# Patient Record
Sex: Male | Born: 1935 | Race: Black or African American | Hispanic: No | Smoking: Current every day smoker
Health system: Southern US, Community
[De-identification: ages and names within clinical notes are randomized; demographics above are authoritative.]

## PROBLEM LIST (undated history)

## (undated) DIAGNOSIS — R05 Cough: Secondary | ICD-10-CM

## (undated) DIAGNOSIS — R42 Dizziness and giddiness: Secondary | ICD-10-CM

## (undated) DIAGNOSIS — R059 Cough, unspecified: Secondary | ICD-10-CM

## (undated) DIAGNOSIS — E119 Type 2 diabetes mellitus without complications: Secondary | ICD-10-CM

## (undated) DIAGNOSIS — H409 Unspecified glaucoma: Secondary | ICD-10-CM

## (undated) DIAGNOSIS — M1990 Unspecified osteoarthritis, unspecified site: Secondary | ICD-10-CM

## (undated) DIAGNOSIS — C259 Malignant neoplasm of pancreas, unspecified: Secondary | ICD-10-CM

## (undated) DIAGNOSIS — K219 Gastro-esophageal reflux disease without esophagitis: Secondary | ICD-10-CM

## (undated) DIAGNOSIS — K259 Gastric ulcer, unspecified as acute or chronic, without hemorrhage or perforation: Secondary | ICD-10-CM

## (undated) DIAGNOSIS — I10 Essential (primary) hypertension: Secondary | ICD-10-CM

## (undated) DIAGNOSIS — N390 Urinary tract infection, site not specified: Secondary | ICD-10-CM

## (undated) DIAGNOSIS — S31119A Laceration without foreign body of abdominal wall, unspecified quadrant without penetration into peritoneal cavity, initial encounter: Secondary | ICD-10-CM

## (undated) DIAGNOSIS — R531 Weakness: Secondary | ICD-10-CM

## (undated) DIAGNOSIS — B1920 Unspecified viral hepatitis C without hepatic coma: Secondary | ICD-10-CM

## (undated) HISTORY — DX: Weakness: R53.1

## (undated) HISTORY — DX: Cough, unspecified: R05.9

## (undated) HISTORY — PX: OTHER SURGICAL HISTORY: SHX169

## (undated) HISTORY — DX: Urinary tract infection, site not specified: N39.0

## (undated) HISTORY — DX: Malignant neoplasm of pancreas, unspecified: C25.9

## (undated) HISTORY — DX: Laceration without foreign body of abdominal wall, unspecified quadrant without penetration into peritoneal cavity, initial encounter: S31.119A

## (undated) HISTORY — DX: Cough: R05

## (undated) HISTORY — DX: Unspecified glaucoma: H40.9

## (undated) HISTORY — PX: LAPAROSCOPIC GASTROTOMY W/ REPAIR OF ULCER: SUR772

## (undated) HISTORY — DX: Dizziness and giddiness: R42

---

## 2005-09-18 ENCOUNTER — Emergency Department: Payer: Self-pay | Admitting: Emergency Medicine

## 2006-05-30 ENCOUNTER — Other Ambulatory Visit: Payer: Self-pay

## 2006-05-31 ENCOUNTER — Inpatient Hospital Stay: Payer: Self-pay | Admitting: Internal Medicine

## 2008-03-28 ENCOUNTER — Inpatient Hospital Stay: Payer: Self-pay | Admitting: Internal Medicine

## 2008-03-28 ENCOUNTER — Other Ambulatory Visit: Payer: Self-pay

## 2008-03-29 ENCOUNTER — Other Ambulatory Visit: Payer: Self-pay

## 2009-07-02 ENCOUNTER — Emergency Department: Payer: Self-pay | Admitting: Unknown Physician Specialty

## 2013-02-25 LAB — COMPREHENSIVE METABOLIC PANEL
Albumin: 2.7 g/dL — ABNORMAL LOW (ref 3.4–5.0)
Alkaline Phosphatase: 85 U/L (ref 50–136)
BUN: 34 mg/dL — ABNORMAL HIGH (ref 7–18)
Co2: 23 mmol/L (ref 21–32)
Creatinine: 1.65 mg/dL — ABNORMAL HIGH (ref 0.60–1.30)
Glucose: 140 mg/dL — ABNORMAL HIGH (ref 65–99)
Osmolality: 291 (ref 275–301)
Potassium: 3.9 mmol/L (ref 3.5–5.1)
SGOT(AST): 25 U/L (ref 15–37)
Total Protein: 6.4 g/dL (ref 6.4–8.2)

## 2013-02-25 LAB — CBC
HCT: 26.4 % — ABNORMAL LOW (ref 40.0–52.0)
HGB: 8.8 g/dL — ABNORMAL LOW (ref 13.0–18.0)
MCH: 29.8 pg (ref 26.0–34.0)
MCHC: 33.3 g/dL (ref 32.0–36.0)

## 2013-02-25 LAB — URINALYSIS, COMPLETE
Bacteria: NONE SEEN
Bilirubin,UR: NEGATIVE
Blood: NEGATIVE
Glucose,UR: NEGATIVE mg/dL (ref 0–75)
Granular Cast: 41
Ketone: NEGATIVE
Nitrite: NEGATIVE
Protein: NEGATIVE
Squamous Epithelial: 5

## 2013-02-25 LAB — PHOSPHORUS: Phosphorus: 3.7 mg/dL (ref 2.5–4.9)

## 2013-02-25 LAB — CK TOTAL AND CKMB (NOT AT ARMC): CK, Total: 59 U/L (ref 35–232)

## 2013-02-26 ENCOUNTER — Inpatient Hospital Stay: Payer: Self-pay | Admitting: Internal Medicine

## 2013-02-26 LAB — CK TOTAL AND CKMB (NOT AT ARMC)
CK, Total: 46 U/L (ref 35–232)
CK, Total: 42 U/L (ref 35–232)
CK-MB: 1.2 ng/mL (ref 0.5–3.6)

## 2013-02-27 LAB — COMPREHENSIVE METABOLIC PANEL
Alkaline Phosphatase: 83 U/L (ref 50–136)
BUN: 18 mg/dL (ref 7–18)
Bilirubin,Total: 0.3 mg/dL (ref 0.2–1.0)
Calcium, Total: 8.4 mg/dL — ABNORMAL LOW (ref 8.5–10.1)
Chloride: 113 mmol/L — ABNORMAL HIGH (ref 98–107)
Co2: 26 mmol/L (ref 21–32)
Creatinine: 0.96 mg/dL (ref 0.60–1.30)
EGFR (African American): >60
EGFR (Non-African Amer.): >60
Glucose: 83 mg/dL (ref 65–99)
Osmolality: 290 (ref 275–301)
Potassium: 4.4 mmol/L (ref 3.5–5.1)
SGPT (ALT): 21 U/L (ref 12–78)
Sodium: 145 mmol/L (ref 136–145)

## 2013-02-27 LAB — DRUG SCREEN, URINE
Benzodiazepine, Ur Scrn: NEGATIVE (ref ?–200)
Cannabinoid 50 Ng, Ur ~~LOC~~: POSITIVE (ref ?–50)
Cocaine Metabolite,Ur ~~LOC~~: NEGATIVE (ref ?–300)
Methadone, Ur Screen: NEGATIVE (ref ?–300)
Opiate, Ur Screen: NEGATIVE (ref ?–300)
Tricyclic, Ur Screen: NEGATIVE (ref ?–1000)

## 2013-02-27 LAB — CBC WITH DIFFERENTIAL/PLATELET
Basophil #: 0 10*3/uL (ref 0.0–0.1)
Basophil %: 0.6 %
Eosinophil #: 0.1 10*3/uL (ref 0.0–0.7)
HCT: 27.7 % — ABNORMAL LOW (ref 40.0–52.0)
HGB: 9.3 g/dL — ABNORMAL LOW (ref 13.0–18.0)
Lymphocyte %: 34.5 %
MCH: 29.8 pg (ref 26.0–34.0)
MCHC: 33.5 g/dL (ref 32.0–36.0)
Monocyte %: 9.8 %
Neutrophil %: 52 %
Platelet: 119 10*3/uL — ABNORMAL LOW (ref 150–440)
RDW: 14.5 % (ref 11.5–14.5)
WBC: 4.8 10*3/uL (ref 3.8–10.6)

## 2013-02-27 LAB — PROTIME-INR
INR: 1
Prothrombin Time: 13.5 secs (ref 11.5–14.7)

## 2015-03-19 NOTE — Consult Note (Signed)
Brief Consult Note: Diagnosis: Anemia with heme positive stools.   Patient was seen by consultant.   Comments: Patient with multiple issues including dropping hemoglobin and heme positive stools. No signs of active GI bleed. Patient with h/o cirrhosis and does not remember having an EGD. Continue Protonix. Follow H and H. EGD in am. PT/INR in am. Will follow.  Electronic Signatures: Jill Side (MD)  (Signed 02-Apr-14 18:58)  Authored: Brief Consult Note   Last Updated: 02-Apr-14 18:58 by Jill Side (MD)

## 2015-03-19 NOTE — Consult Note (Signed)
PATIENT NAME:  Gerald Molina, Gerald Molina MR#:  [id number] DATE OF BIRTH:  07-06-1935  DATE OF CONSULTATION:  02/27/2013  REFERRING PHYSICIAN:   CONSULTING PHYSICIAN:  Jill Side, MD  REASON FOR CONSULTATION: Heme-positive stool and anemia.   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 He was admitted the day before yesterday with syncope. The patient was also reporting nausea, vomiting, and diarrhea for about 3 days after which he started to feel weak and apparently passed out. No urinary or stool incontinence was reported. No seizure-like activity either. CT scan of the head was unremarkable in the Emergency Room. The patient's blood pressure was somewhat low in the 90s and his creatinine was somewhat high consistent with volume depletion and dehydration. The patient's hemoglobin was noted to be low at 8.8 with a baseline of about 11. His stools were heme-positive. The patient is on iron and therefore color of the stool was not of any great help. He denies any nausea, vomiting, bright red blood per rectum or hematemesis. Apparently, the patient has had a colonoscopy at the [place], but has not had an EGD according to him.   PAST MEDICAL HISTORY: Diabetes, hypertension, cirrhosis of the liver, anemia and history of glaucoma.   PAST SURGICAL HISTORY: None.   HOME MEDICATIONS: Aspirin 325 mg a day, glyburide, metformin, simvastatin, hydroxyzine, Norvasc and ferrous sulfate.   SOCIAL HISTORY: Smokes about a pack a day. Quit his drinking about 20 years ago.   REVIEW OF SYSTEMS: Grossly negative now except for what is mentioned in the history of present illness.   PHYSICAL EXAMINATION:  GENERAL: Chronically ill-appearing male, does not appear to  be in any acute distress, quite awake and alert.  VITAL SIGNS: Temperature 98.4, heart rate is in 50s, respirations 18 to 20, blood pressure 146/83. Clinically he is not jaundiced.  NECK: Neck veins are flat.  LUNGS: Grossly clear to auscultation.  CARDIOVASCULAR: Regular rate and rhythm. No gallops or murmurs.  ABDOMEN: Did not reveal any hepatosplenomegaly. No ascites was noted. Bowel sounds are positive. Nontender.  NEUROLOGIC: Appears to be fairly nonfocal.   LABORATORIES: Hemoglobin on admission was 8.8, platelet count is slightly reduced at 139, MCV is normal at 90 and white cell count is normal at 7.1. Hemoglobin today is 9.3, white cell count is 4.8, platelet count 119. As mentioned above, his stools were occult blood positive. INR is 1. Liver enzymes are normal, although albumin is low at 2.5. CT of the head unremarkable.   ASSESSMENT AND PLAN: A patient with history of cirrhosis of the liver secondary to hepatitis C and history of ethanol abuse in the past. The patient came in with nausea, vomiting and diarrhea. He was dehydrated with high creatinine and he passed out. The patient also is  anemic and his hemoglobin is much lower than his baseline raising some concerns about possible recent GI bleed. Due to his history of underlying cirrhosis, upper GI bleed from lesion such as portal hypertensive gastropathy is most likely. Apparently, the patient has not had an EGD or at least not recently and therefore it was recommended that he undergo an EGD for further evaluation and to rule out esophageal varices, although this does not appear to be case of variceal  bleeding. The patient is in full agreement. Will proceed with an upper GI endoscopy today. Further recommendations depending on the results of the EGD. His hemoglobin and hematocrit now seem to be stable. Will follow.  ____________________________ Jill Side, MD si:aw D: 02/27/2013 11:39:59 ET T: 02/27/2013 11:49:20  ET JOB#: [id number]  cc: Jill Side, MD, <Dictator> Jill Side MD ELECTRONICALLY SIGNED 03/01/2013 16:05

## 2015-03-19 NOTE — Discharge Summary (Signed)
PATIENT NAME:  Gerald Molina, Gerald Molina MR#:  [id number] DATE OF BIRTH:  [birth year]  DATE OF ADMISSION:  02/26/2013 DATE OF DISCHARGE:  02/27/2013  ADMITTING PHYSICIAN:  Phillips Climes, MD  DISCHARGING PHYSICIAN:  Gladstone Lighter, MD  PRIMARY CARE PHYSICIAN: [facility]:  GI consultation by Dr. Dionne Milo.   DISCHARGE DIAGNOSES:   1.  Near syncope likely vasovagal.  2.  Acute on chronic anemia from chronic upper gastrointestinal bleed status post esophagogastroduodenoscopy showing a gastric ulcer. No transfusion requirements this hospitalization.  3.  Acute renal failure.  4.  Hepatitis C and liver cirrhosis.  5.  Tobacco use disorder.  6.  Degenerative arthritis.  7.  Urinary tract infection.  8.  Diabetes mellitus.   DISCHARGE HOME MEDICATIONS: 1.  Ferrous sulfate 325 mg p.o. b.i.d.  2.  Metformin 500 mg p.o. b.i.d.  3.  Glyburide 5 mg p.o. b.i.d.  4.  Hydroxyzine 10 mg 1 tablet once a day. 5.  Brimonidine ophthalmic solution 1 drop to each eye 3 times a day.  6.  Colace 100 mg p.o. daily.  7.  Dorzolamide/Timolol ophthalmic solution 1 drop to each eye twice a day. 8.  Latanoprost 0.005% ophthalmic solution 1 drop to each eye at bedtime.  9.  Simvastatin 5 mg at bedtime.  10.  Glucerna shakes between meals.  11.  Norvasc 5 mg p.o. daily.  12.  Levaquin 250 mg for 3 more days.  13.  Protonix 40 mg p.o. daily.   DISCHARGE DIET: Low-sodium diet.   DISCHARGE ACTIVITY: As tolerated.   FOLLOWUP INSTRUCTIONS: 1.  PCP follow up. 2.  [facility]   LABORATORY DATA AND IMAGING STUDIES:  Urine tox screen positive for marijuana. WBC 4.8, hemoglobin 9.3, hematocrit 27.7, platelet count 119.  Sodium 145, potassium 4.4, chloride 113, bicarb 26, BUN 18, creatinine 0.96, glucose 83 and calcium 8.4.   INR 1.9.   CT of the head without contrast showing no acute intracranial abnormalities. Urinalysis with 1+ leukocyte esterase, few WBCs and  also bacteria.  Chest x-ray showing no acute cardiopulmonary disease, coarse right lower lung markings, possibly fibrosis.     Upper GI endoscopy done by Dr. Dionne Milo on 02/27/2013 showing gastritis, hiatal hernia and duodenal ulcer with clean base.   BRIEF HOSPITAL COURSE:  The patient is a 80 year old African American male with past medical history significant for diabetes mellitus, arthritis, liver cirrhosis and also hepatitis C who is usually followed up at [place] and had a screening colonoscopy within the past year and he says no abnormalities were comes.  He comes to the hospital after he had a syncopal episode.   1.  Syncope, likely vasovagal.  The patient  was hypotensive, dehydrated and also was found to be in acute renal failure on admission. He has not had further syncopal episodes while in the hospital. Orthostatic blood pressures have improved after IV fluids were given to the patient.  He was monitored on the cardiac unit without any further syncope. He has also has not had any arrhythmias.   2.  Acute on chronic anemia. His hemoglobin from [place] records 3 weeks ago was about 11. Hemoglobin on admission was 8.8 and he was feeling dizzy and also lightheaded and extremely weak lately. He takes naproxen for his arthritis b.i.d. and also with his cirrhosis. He never had an upper GI endoscopy from what the patient was tell, so upper GI endoscopy was done and it  did not show any varices but had a clean duodenal ulcer, so it could probably be chronic GI bleed.  He was advised to take iron supplement as an outpatient and advised to start Protonix twice a day. His aspirin was being held at the time of discharge.   3.  Acute renal failure improved with IV hydration.  4.  Urinary tract infection. He was on Levaquin and he is being discharged on the same to continue for 23more days.  5.  Diabetes mellitus. Home medications were continued. His course has been otherwise uneventful in the hospital.    DISCHARGE CONDITION: Stable.   DISCHARGE DISPOSITION: To home.  Instructions were given to both the patient and daughter at the time of discharge.   Time spent on discharge: 45 minutes.     ____________________________ Gladstone Lighter, MD rk:ct D: 02/28/2013 14:22:47 ET T: 02/28/2013 14:35:52 ET JOB#: [id number]  cc: Gladstone Lighter, MD, <Dictator> Gladstone Lighter MD ELECTRONICALLY SIGNED 03/14/2013 15:44

## 2015-03-19 NOTE — H&P (Signed)
PATIENT NAME:  Gerald Molina, Gerald Molina MR#:  [id number] DATE OF BIRTH:  03-Jan-1935  DATE OF ADMISSION:  02/26/2013  REFERRING PHYSICIAN:  Dr. Tamela Oddi Braud.   PRIMARY CARE PHYSICIAN:  Following at [place]:  Syncope.   HISTORY OF PRESENT ILLNESS:  This is a pleasant 80 year old male with significant past medical history of liver cirrhosis secondary to hepatitis C and alcohol abuse, reports no alcohol abuse in the last 20 years, history of diabetes, hypertension, anemia, that presents with complaints of syncope, the patient reports he has been having nausea, vomiting and diarrhea for the last three days, reports has been feeling weak, but denies any fever or chills, reports he has been feeling dizzy over the last couple of days, mainly when he stands up, reports he went to open the door for his granddaughter, when he felt dizzy and then he fell on the floor where he passed out for a few minutes, no urinary or stool incontinence, or no seizure-like activity, after when he woke up there was no confusion or altered mental status, in the ED CT brain did not show any acute finding, the patient was slightly hypotensive with systolic blood pressure in the mid-90s, but was not orthostatic, the patient was found to have elevated creatinine from his baseline which is around 1.3 to 1.6, as well he had positive urinalysis, and as well he had significant drop on his hemoglobin from baseline 11 earlier this month to 8.8, the patient was fecal occult blood positive in ED, but as well he is on iron supplement, the patient has not been in the hospital for some time, his medical records were obtained from [facility], the patient denies any chest pain, any shortness of breath, any coffee-ground emesis, any melena or bright red blood per rectum, the patient received IV fluids where he reports he has been feeling much better after that, hospitalist service were requested to admit the patient  for further management and work-up of his syncope.   PAST MEDICAL HISTORY: 1.  Diabetes.  2.  Hypertension.  3.  Cirrhosis of liver secondary to chronic hepatitis C and alcohol abuse.  4.  Anemia.  5.  Open angle glaucoma. 6.  Tobacco abuse.  PAST SURGICAL HISTORY:  None.   HOME MEDICATIONS:   1.  Aspirin 325 mg oral daily.  2.  Glyburide 5 mg 2 times a day.  3.  Metformin 500 mg 2 times a day.  4.  Simvastatin 5 mg oral daily.  5.  Hydroxyzine as needed.  6.  Norvasc 5 mg oral daily.  7.  Ferrous sulfate 325 mg oral 2 times a day.  8.  Docusate sodium 100 mg oral daily.  10.  Dorzolamide timolol ophthalmic one drop both eyes 2 times a day.  11.  Latanoprost 0.005% one drop both eyes daily.  12.  Glucerna 3 times a day.   SOCIAL HISTORY:  The patient lives by himself.  He smokes 1 pack per day.  Quit drinking 20 to 25 years ago.  He still smokes marijuana occasionally.  He reports he quit using cocaine for 20 years ago, but when reviewed his medical record a few years ago he was still reporting he was using cocaine.   FAMILY HISTORY:  Significant for hypertension, diabetes mellitus.   REVIEW OF SYSTEMS:  CONSTITUTIONAL:  Denies fever, chills, fatigue, or weakness.  EYES:  Denies blurry vision, double vision, pain, inflammation.  Has history of glaucoma.  EARS, NOSE, THROAT:  Denies tinnitus, ear pain, hearing loss, or epistaxis.  RESPIRATORY:  Denies cough, wheezing, hemoptysis, dyspnea or COPD.  CARDIOVASCULAR:  Denies chest pain, edema, arrhythmia, palpitation.  Has syncope.  GASTROINTESTINAL:  Complains of nausea, vomiting, diarrhea.  Denies abdominal pain, hematemesis, melena, coffee-ground emesis, rectal bleed, bright red blood per rectum, or jaundice.  GENITOURINARY:  Denies dysuria, hematuria, renal colic.  ENDOCRINE:  Denies polyuria, polydipsia, heat or cold intolerance.  HEMATOLOGY:  Denies easy bruising, bleeding, diathesis.  Reports history of anemia.   INTEGUMENTARY:  Denies acne, rash or skin lesions.  MUSCULOSKELETAL:  Denies neck pain, shoulder pain, back pain, gout or cramps.  NEUROLOGIC:  Denies dysarthria, epilepsy, tremors, vertigo, headache or migraine.  PSYCHIATRIC:  Denies anxiety, insomnia, bipolar disorder or schizophrenia.   PHYSICAL EXAMINATION: VITAL SIGNS:  Temperature 97.5, pulse 57, blood pressure 94/49 supine.  Standing up, pulse 70, blood pressure 112/59.  GENERAL:  Elderly male, looks comfortable and in no apparent distress.  HEENT:  Head atraumatic, normocephalic.  Pupils equal, reactive to light.  Pink conjunctivae.  Anicteric sclerae.  Moist oral mucosa.  NECK:  Supple.  No thyromegaly.  No JVD.  CHEST:  Good air entry bilaterally.  No wheezing, rales, rhonchi.  CARDIOVASCULAR:  S1, S2 heard.  No rubs, murmurs, or gallops.  ABDOMEN:  Soft, nontender, nondistended.  Bowel sounds present.  EXTREMITIES:  No edema.  No clubbing.  No cyanosis.  PSYCHIATRIC:  Appropriate affect.  Awake, alert x 3.  Intact judgment and insight.  NEUROLOGIC:  Cranial nerves grossly intact.  Motor 5 out of 5.  Sensation is symmetrical and intact.  Dorsalis pedis +2 bilaterally.  LYMPHATICS:  No cervical lymph node enlargement could be appreciated.  SKIN:  Normal skin turgor.  Warm and dry.   PERTINENT LABORATORY DATA:  Glucose 140, BUN 34, creatinine 1.65, sodium 141, potassium 3.9, chloride 109, CO2 23, total protein 6.4, albumin 2.7, ALT 24, AST 25, alkaline phosphatase 85.  Troponin less than 0.02.  White blood cells 7.1, hemoglobin 8.8, hematocrit 26.4, platelets 139.  Urinalysis showing 13 white blood cells and +1 leukocyte esterase.  EKG showing bifascicular block with a ventricular rate of 57.   ASSESSMENT AND PLAN: 1.  Syncope.  This is most likely from dehydration and volume depletion as patient has been having nausea, vomiting, diarrhea for a few days, we will start patient on IV fluids for hydration, we will hold his hypertensive  medications, as well he has been complaining of dizziness for some time, where he has his hydrochlorothiazide held by [facility], as well had his Norvasc supposed to be held which he is still taking, as well having anemia and urinary tract infection contributing to his syncope, as well given fact his abnormal EKG with bifascicular block and will be admitted to tele and we will continue to cycle his troponins as well.  2.  Acute renal failure.  This is secondary to dehydration from his nausea, vomiting and diarrhea.  We will continue with IV fluids.  We will check BMP in a.m.  3.  Anemia, once [facility], last hemoglobin was 11 on March 6th, which dropped to 8.8, the patient was fecal occult blood test positive in ED, but did not have any melena and had normal color stool, the patient is on iron so maybe it is falsely positive, but given the fact that it is a significant drop of his hemoglobin we will consult GI service, we will keep him  on clear liquid diet with IV Protonix 40 mg by mouth twice daily, the patient denies any history of varices, as well does not mention any history of varices in his documentation from the [facility] hospital, we will continue him on iron supplement.  We will monitor closely, we will hold his aspirin.  4.  Urinary tract infection.  We will continue him on Rocephin.  5.  Diabetes mellitus.  We will hold oral hypoglycemic agents, mainly metformin given his renal failure and we will keep him on insulin sliding scale.  6.  Tobacco abuse.  The patient was counseled and currently he does not want to be on NicoDerm patch.  He says he will try to quit on his own.  7.  Hypertension.  The patient is hypotensive.  We will hold his Norvasc and we will not discharge him on Norvasc as well as he is supposed to discontinue it as per [facility] recommendations.  8.  Cirrhosis of the liver secondary to chronic hepatitis C and alcohol abuse, the patient's LFTs are within normal  limits, and this has been followed up regularly at the [facility].  His platelet count is on the lower side.  9.  Deep vein thrombosis prophylaxis.  Sequential compression device given his anemia.  10.  Gastrointestinal prophylaxis, on IV Protonix.   CODE STATUS:  FULL CODE.   TOTAL TIME SPENT ON ADMISSION AND PATIENT CARE:  60 minutes.    ____________________________ Albertine Patricia, MD dse:ea D: 02/26/2013 01:27:45 ET T: 02/26/2013 02:36:02 ET JOB#: [id number]  cc: Albertine Patricia, MD, <Dictator> Kiya Eno Graciela Husbands MD ELECTRONICALLY SIGNED 03/07/2013 7:15

## 2015-11-30 ENCOUNTER — Emergency Department: Payer: Medicare Other

## 2015-11-30 ENCOUNTER — Inpatient Hospital Stay
Admission: EM | Admit: 2015-11-30 | Discharge: 2015-12-03 | DRG: 438 | Disposition: A | Discharge status: Home / Self Care | Payer: Medicare Other | Attending: Internal Medicine | Admitting: Internal Medicine

## 2015-11-30 DIAGNOSIS — Z7982 Long term (current) use of aspirin: Secondary | ICD-10-CM

## 2015-11-30 DIAGNOSIS — N390 Urinary tract infection, site not specified: Secondary | ICD-10-CM | POA: Diagnosis present

## 2015-11-30 DIAGNOSIS — Z681 Body mass index (BMI) 19 or less, adult: Secondary | ICD-10-CM

## 2015-11-30 DIAGNOSIS — E860 Dehydration: Secondary | ICD-10-CM | POA: Diagnosis present

## 2015-11-30 DIAGNOSIS — K7460 Unspecified cirrhosis of liver: Secondary | ICD-10-CM | POA: Diagnosis present

## 2015-11-30 DIAGNOSIS — R1084 Generalized abdominal pain: Secondary | ICD-10-CM | POA: Diagnosis not present

## 2015-11-30 DIAGNOSIS — Z8711 Personal history of peptic ulcer disease: Secondary | ICD-10-CM

## 2015-11-30 DIAGNOSIS — B1920 Unspecified viral hepatitis C without hepatic coma: Secondary | ICD-10-CM | POA: Diagnosis present

## 2015-11-30 DIAGNOSIS — K8689 Other specified diseases of pancreas: Principal | ICD-10-CM | POA: Insufficient documentation

## 2015-11-30 DIAGNOSIS — E43 Unspecified severe protein-calorie malnutrition: Secondary | ICD-10-CM | POA: Insufficient documentation

## 2015-11-30 DIAGNOSIS — F17210 Nicotine dependence, cigarettes, uncomplicated: Secondary | ICD-10-CM | POA: Diagnosis present

## 2015-11-30 DIAGNOSIS — N170 Acute kidney failure with tubular necrosis: Secondary | ICD-10-CM | POA: Diagnosis present

## 2015-11-30 DIAGNOSIS — M1990 Unspecified osteoarthritis, unspecified site: Secondary | ICD-10-CM | POA: Diagnosis present

## 2015-11-30 DIAGNOSIS — Z79899 Other long term (current) drug therapy: Secondary | ICD-10-CM

## 2015-11-30 DIAGNOSIS — I10 Essential (primary) hypertension: Secondary | ICD-10-CM | POA: Diagnosis present

## 2015-11-30 DIAGNOSIS — E119 Type 2 diabetes mellitus without complications: Secondary | ICD-10-CM | POA: Diagnosis present

## 2015-11-30 DIAGNOSIS — Z7984 Long term (current) use of oral hypoglycemic drugs: Secondary | ICD-10-CM

## 2015-11-30 DIAGNOSIS — F1290 Cannabis use, unspecified, uncomplicated: Secondary | ICD-10-CM | POA: Diagnosis present

## 2015-11-30 DIAGNOSIS — C787 Secondary malignant neoplasm of liver and intrahepatic bile duct: Secondary | ICD-10-CM | POA: Diagnosis present

## 2015-11-30 DIAGNOSIS — N179 Acute kidney failure, unspecified: Secondary | ICD-10-CM | POA: Diagnosis present

## 2015-11-30 HISTORY — DX: Unspecified osteoarthritis, unspecified site: M19.90

## 2015-11-30 HISTORY — DX: Gastric ulcer, unspecified as acute or chronic, without hemorrhage or perforation: K25.9

## 2015-11-30 HISTORY — DX: Type 2 diabetes mellitus without complications: E11.9

## 2015-11-30 HISTORY — DX: Unspecified viral hepatitis C without hepatic coma: B19.20

## 2015-11-30 LAB — BASIC METABOLIC PANEL
Anion gap: 10 (ref 5–15)
BUN: 43 mg/dL — AB (ref 6–20)
CALCIUM: 9.2 mg/dL (ref 8.9–10.3)
CO2: 39 mmol/L — ABNORMAL HIGH (ref 22–32)
CREATININE: 2.66 mg/dL — AB (ref 0.61–1.24)
Chloride: 84 mmol/L — ABNORMAL LOW (ref 101–111)
GFR calc Af Amer: 25 mL/min — ABNORMAL LOW (ref >60)
GFR, EST NON AFRICAN AMERICAN: 21 mL/min — AB (ref >60)
Glucose, Bld: 318 mg/dL — ABNORMAL HIGH (ref 65–99)
POTASSIUM: 4.1 mmol/L (ref 3.5–5.1)
SODIUM: 133 mmol/L — AB (ref 135–145)

## 2015-11-30 LAB — CBC
HCT: 36.6 % — ABNORMAL LOW (ref 40.0–52.0)
Hemoglobin: 12 g/dL — ABNORMAL LOW (ref 13.0–18.0)
MCH: 27.7 pg (ref 26.0–34.0)
MCHC: 32.9 g/dL (ref 32.0–36.0)
MCV: 84.1 fL (ref 80.0–100.0)
PLATELETS: 190 10*3/uL (ref 150–440)
RBC: 4.35 MIL/uL — ABNORMAL LOW (ref 4.40–5.90)
RDW: 15.6 % — AB (ref 11.5–14.5)
WBC: 7.2 10*3/uL (ref 3.8–10.6)

## 2015-11-30 MED ORDER — SODIUM CHLORIDE 0.9 % IV BOLUS (SEPSIS)
1000.0000 mL | Freq: Once | INTRAVENOUS | Status: AC
  Administered 2015-11-30: 1000 mL via INTRAVENOUS

## 2015-11-30 MED ORDER — ONDANSETRON HCL 4 MG/2ML IJ SOLN
4.0000 mg | Freq: Once | INTRAMUSCULAR | Status: AC
  Administered 2015-11-30: 4 mg via INTRAVENOUS

## 2015-11-30 MED ORDER — IOHEXOL 240 MG/ML SOLN
25.0000 mL | INTRAMUSCULAR | Status: AC
  Administered 2015-11-30: 25 mL via ORAL

## 2015-11-30 MED ORDER — ONDANSETRON HCL 4 MG/2ML IJ SOLN
INTRAMUSCULAR | Status: AC
  Administered 2015-11-30: 4 mg via INTRAVENOUS
  Filled 2015-11-30: qty 2

## 2015-11-30 NOTE — ED Provider Notes (Signed)
[facility] Emergency Department Provider Note  ____________________________________________  Time seen: 11:10 PM  I have reviewed the triage vital signs and the nursing notes.   HISTORY  Chief Complaint Weakness      HPI Gerald Molina is a 81 y.o. male presents with generalized weakness times approximately one week. Family and patient admits to very poor by mouth intake during that period of time in addition to nonbloody emesis. Of note the patient has history of diabetes CBG per EMS was 326. Patient also admits to generalized abdominal discomfort. Of note patient is followed by [facility] where he recently underwent an endoscopy and colonoscopy with "normal results per family. Patient has a history of cirrhosis    Past medical history Cirrhosis Diabetes mellitus  There are no active problems to display for this patient.   Past surgical history No current outpatient prescriptions on file.  Allergies Review of patient's allergies indicates no known allergies.  No family history on file.  Social History Social History  Substance Use Topics  . Smoking status: Not on file  . Smokeless tobacco: Not on file  . Alcohol Use: Not on file    Review of Systems  Constitutional: Negative for fever. Eyes: Negative for visual changes. ENT: Negative for sore throat. Cardiovascular: Negative for chest pain. Respiratory: Negative for shortness of breath. Gastrointestinal: Positive for abdominal pain and vomiting Genitourinary: Negative for dysuria. Musculoskeletal: Negative for back pain. Skin: Negative for rash. Neurological: Negative for headaches, focal weakness or numbness.   10-point ROS otherwise negative.  ____________________________________________   PHYSICAL EXAM:  VITAL SIGNS: ED Triage Vitals  Enc Vitals Group     BP 11/30/15 2245 106/71 mmHg     Pulse Rate 11/30/15 2245 61     Resp 11/30/15 2245 16     Temp 11/30/15 2245 97.8 F  (36.6 C)     Temp Source 11/30/15 2245 Oral     SpO2 11/30/15 2240 95 %     Weight 11/30/15 2245 125 lb (56.7 kg)     Height 11/30/15 2245 6' (1.829 m)     Head Cir --      Peak Flow --      Pain Score --      Pain Loc --      Pain Edu? --      Excl. in [place]? --      Constitutional: Alert and oriented. Well appearing and in no distress. Eyes: Conjunctivae are normal. PERRL. Normal extraocular movements. ENT   Head: Normocephalic and atraumatic.   Nose: No congestion/rhinnorhea.   Mouth/Throat: Very dry oral and nasal mucosa   Neck: No stridor. Hematological/Lymphatic/Immunilogical: No cervical lymphadenopathy. Cardiovascular: Normal rate, regular rhythm. Normal and symmetric distal pulses are present in all extremities. No murmurs, rubs, or gallops. Respiratory: Normal respiratory effort without tachypnea nor retractions. Breath sounds are clear and equal bilaterally. No wheezes/rales/rhonchi. Gastrointestinal: Soft and nontender. No distention. There is no CVA tenderness. Genitourinary: deferred Musculoskeletal: Nontender with normal range of motion in all extremities. No joint effusions.  No lower extremity tenderness nor edema. Neurologic:  Normal speech and language. No gross focal neurologic deficits are appreciated. Speech is normal.  Skin:  Skin is warm, dry and intact. No rash noted. Psychiatric: Mood and affect are normal. Speech and behavior are normal. Patient exhibits appropriate insight and judgment.  ____________________________________________    LABS (pertinent positives/negatives)  Labs Reviewed  BASIC METABOLIC PANEL - Abnormal; Notable for the following:    Sodium  133 (*)    Chloride 84 (*)    CO2 39 (*)    Glucose, Bld 318 (*)    BUN 43 (*)    Creatinine, Ser 2.66 (*)    GFR calc non Af Amer 21 (*)    GFR calc Af Amer 25 (*)    All other components within normal limits  CBC - Abnormal; Notable for the following:    RBC 4.35 (*)     Hemoglobin 12.0 (*)    HCT 36.6 (*)    RDW 15.6 (*)    All other components within normal limits  URINALYSIS COMPLETEWITH MICROSCOPIC ([facility] ONLY) - Abnormal; Notable for the following:    Color, Urine YELLOW (*)    APPearance HAZY (*)    Glucose, UA 50 (*)    Hgb urine dipstick 1+ (*)    Protein, ur 30 (*)    Leukocytes, UA 3+ (*)    Bacteria, UA RARE (*)    Squamous Epithelial / LPF 0-5 (*)    All other components within normal limits  GLUCOSE, CAPILLARY - Abnormal; Notable for the following:    Glucose-Capillary 298 (*)    All other components within normal limits  HEPATIC FUNCTION PANEL - Abnormal; Notable for the following:    Total Protein 6.4 (*)    Albumin 2.8 (*)    ALT 10 (*)    All other components within normal limits  BASIC METABOLIC PANEL - Abnormal; Notable for the following:    Chloride 98 (*)    CO2 34 (*)    Glucose, Bld 217 (*)    BUN 27 (*)    Creatinine, Ser 1.57 (*)    Calcium 8.2 (*)    GFR calc non Af Amer 41 (*)    GFR calc Af Amer 47 (*)    All other components within normal limits  GLUCOSE, CAPILLARY - Abnormal; Notable for the following:    Glucose-Capillary 170 (*)    All other components within normal limits  GLUCOSE, CAPILLARY - Abnormal; Notable for the following:    Glucose-Capillary 168 (*)    All other components within normal limits  PROTIME-INR  CEA  CANCER ANTIGEN 19-9  CBG MONITORING, ED    ____________________________________________   EKG  ED ECG REPORT I, Brysten Reister, Ragan N, the attending physician, personally viewed and interpreted this ECG.   Date: 12/02/2015  EKG Time: 11:00 PM  Rate: 55  Rhythm: Sinus bradycardia  Axis: None  Intervals: Normal  ST&T Change: None   ____________________________________________    RADIOLOGY CT Abdomen Pelvis Wo Contrast (Final result) Result time: 12/01/15 02:08:18   Procedure changed from CT Abdomen Pelvis W Contrast      Final result by Rad Results In Interface  (12/01/15 02:08:18)   Narrative:   CLINICAL DATA: Generalized abdominal discomfort. Weakness, nausea and vomiting for 1 week.  EXAM: CT ABDOMEN AND PELVIS WITHOUT CONTRAST  TECHNIQUE: Multidetector CT imaging of the abdomen and pelvis was performed following the standard protocol without IV contrast.  COMPARISON: None.  FINDINGS: Lower chest: Mild dependent atelectasis in both lung bases. Coronary artery calcifications are seen.  Liver: There is a 1.8 cm subcapsular hypodensity adjacent to the falciform ligament. Small hypodensity in the inferior right lower lobe. Slight nodular hepatic contours.  Hepatobiliary: Gallbladder filled with stones. No gross biliary dilatation.  Pancreas: Rounded masslike density in the region of the pancreatic head/ neck. This is suboptimally defined without contrast however measures at least 3.9 cm. This abuts  the fourth portion of the duodenum and likely mesenteric vessels. There is an additional ill-defined soft tissue density in the region of the porta hepatis. There is no pancreatic ductal dilatation, pancreatic duct measures 2 mm in the body.  Spleen: Normal.  Adrenal glands: Left adrenal nodularity measuring 1.3 cm.  Kidneys: Calcifications at the renal hila, likely vascular. No hydronephrosis. No perinephric stranding.  Stomach/Bowel: Small hiatal hernia with distal esophageal thickening and thickening of the gastroesophageal junction. No bowel obstruction. There are no dilated or thickened small bowel loops. Motor volume of stool throughout the colon without colonic wall thickening. Mild distal diverticulosis without diverticulitis The appendix is normal.  Vascular/Lymphatic: No retroperitoneal adenopathy. Abdominal aorta is normal in caliber. Dense atherosclerosis without aneurysm.  Reproductive: Heterogeneous enlargement of prostate gland causing mass effect on the bladder base. Prostate gland measures 5.8 cm cranial  caudal dimension.  Bladder: Physiologically distended, bladder diverticulum noted at the dome.  Other: No free air, free fluid, or intra-abdominal fluid collection. Minimal soft tissue density in the right inguinal canal.  Musculoskeletal: Near complete disc space loss at L3-L4 with adjacent endplate irregularity, endplate sclerosis and possible erosions. There is sclerosis in ill-defined lucencies throughout the L3 vertebral body, with similar appearance extending into the posterior elements bilaterally.  IMPRESSION: 1. Soft tissue density in the region of the pancreatic head, incompletely characterized without contrast, however concerning for malignancy. No pancreatic ductal dilatation. Additional soft tissue density in the region the porta hepatis may reflect adenopathy. 2. Small ill-defined liver lesion, in this setting concerning for metastatic disease. Additional lesion adjacent the falciform ligament may reflect fatty infiltration versus additional lesion. Findings incompletely characterized without contrast per 3. Abnormal appearance of L3-L4 in the lumbar spine is nonspecific. Paget's disease of L3 is considered, however given the adjacent irregularity of superior endplate of L4, sequela of remote infection could have a similar appearance. No paravertebral stranding or soft tissue abnormality to suggest acute infection at this time. Metastatic disease not excluded. 4. Chronic findings include cholelithiasis, enlarged prostate gland causing mass effect on the bladder base, diverticulosis, and dense atherosclerosis. 5. Nonspecific nodularity of the left adrenal gland. Please note no comparison exams are available, and lack of intravenous contrast limits evaluation of the solid viscera.   Electronically Signed By: Jeb Levering M.D. On: 12/01/2015 02:08          DG Chest Portable 1 View (Final result) Result time: 12/01/15 00:07:41   Final result by Rad  Results In Interface (12/01/15 00:07:41)   Narrative:   CLINICAL DATA: Dizziness, coughing and vomiting for 1 week  EXAM: PORTABLE CHEST 1 VIEW  COMPARISON: 02/25/2013  FINDINGS: Normal mediastinum and cardiac silhouette. Normal pulmonary vasculature. No evidence of effusion, infiltrate, or pneumothorax. No acute bony abnormality.  IMPRESSION: No acute cardiopulmonary process.   Electronically Signed By: Suzy Bouchard M.D. On: 12/01/2015 00:07            INITIAL IMPRESSION / ASSESSMENT AND PLAN / ED COURSE  Pertinent labs & imaging results that were available during my care of the patient were reviewed by me and considered in my medical decision making (see chart for details).  She received 2 L IV normal saline the emergency department as well as IV Zofran 4 mg. Given renal insufficiency poor by mouth intake and concern for pancreatic mass patient will be admitted to the hospital for further evaluation and management.  ____________________________________________   FINAL CLINICAL IMPRESSION(S) / ED DIAGNOSES  Final diagnoses:  Generalized abdominal discomfort  Renal insufficiency    Gregor Hams, MD 12/02/15 [phone]

## 2015-11-30 NOTE — ED Notes (Signed)
Pt arrived by EMS from home with C/O weakness, N/V for 1 week. EMS reports hx of diabetes, CBG 326. EMS reports pt has not been able to eat or drink in several days.

## 2015-12-01 ENCOUNTER — Emergency Department: Payer: Medicare Other

## 2015-12-01 ENCOUNTER — Encounter: Payer: Self-pay | Admitting: Internal Medicine

## 2015-12-01 DIAGNOSIS — N179 Acute kidney failure, unspecified: Secondary | ICD-10-CM | POA: Diagnosis present

## 2015-12-01 DIAGNOSIS — Z7984 Long term (current) use of oral hypoglycemic drugs: Secondary | ICD-10-CM | POA: Diagnosis not present

## 2015-12-01 DIAGNOSIS — K769 Liver disease, unspecified: Secondary | ICD-10-CM | POA: Diagnosis not present

## 2015-12-01 DIAGNOSIS — F17210 Nicotine dependence, cigarettes, uncomplicated: Secondary | ICD-10-CM | POA: Diagnosis present

## 2015-12-01 DIAGNOSIS — R5383 Other fatigue: Secondary | ICD-10-CM

## 2015-12-01 DIAGNOSIS — E43 Unspecified severe protein-calorie malnutrition: Secondary | ICD-10-CM | POA: Diagnosis present

## 2015-12-01 DIAGNOSIS — R5381 Other malaise: Secondary | ICD-10-CM

## 2015-12-01 DIAGNOSIS — Z7982 Long term (current) use of aspirin: Secondary | ICD-10-CM | POA: Diagnosis not present

## 2015-12-01 DIAGNOSIS — Z794 Long term (current) use of insulin: Secondary | ICD-10-CM

## 2015-12-01 DIAGNOSIS — F1290 Cannabis use, unspecified, uncomplicated: Secondary | ICD-10-CM | POA: Diagnosis present

## 2015-12-01 DIAGNOSIS — N170 Acute kidney failure with tubular necrosis: Secondary | ICD-10-CM | POA: Diagnosis present

## 2015-12-01 DIAGNOSIS — K869 Disease of pancreas, unspecified: Secondary | ICD-10-CM | POA: Diagnosis not present

## 2015-12-01 DIAGNOSIS — K8689 Other specified diseases of pancreas: Secondary | ICD-10-CM | POA: Diagnosis present

## 2015-12-01 DIAGNOSIS — Z681 Body mass index (BMI) 19 or less, adult: Secondary | ICD-10-CM | POA: Diagnosis not present

## 2015-12-01 DIAGNOSIS — R112 Nausea with vomiting, unspecified: Secondary | ICD-10-CM

## 2015-12-01 DIAGNOSIS — E860 Dehydration: Secondary | ICD-10-CM | POA: Diagnosis present

## 2015-12-01 DIAGNOSIS — Z8711 Personal history of peptic ulcer disease: Secondary | ICD-10-CM | POA: Diagnosis not present

## 2015-12-01 DIAGNOSIS — R531 Weakness: Secondary | ICD-10-CM

## 2015-12-01 DIAGNOSIS — R1084 Generalized abdominal pain: Secondary | ICD-10-CM | POA: Diagnosis present

## 2015-12-01 DIAGNOSIS — K7460 Unspecified cirrhosis of liver: Secondary | ICD-10-CM | POA: Diagnosis present

## 2015-12-01 DIAGNOSIS — Z79899 Other long term (current) drug therapy: Secondary | ICD-10-CM | POA: Diagnosis not present

## 2015-12-01 DIAGNOSIS — R109 Unspecified abdominal pain: Secondary | ICD-10-CM

## 2015-12-01 DIAGNOSIS — C787 Secondary malignant neoplasm of liver and intrahepatic bile duct: Secondary | ICD-10-CM | POA: Diagnosis present

## 2015-12-01 DIAGNOSIS — M1990 Unspecified osteoarthritis, unspecified site: Secondary | ICD-10-CM | POA: Diagnosis present

## 2015-12-01 DIAGNOSIS — E119 Type 2 diabetes mellitus without complications: Secondary | ICD-10-CM | POA: Diagnosis present

## 2015-12-01 DIAGNOSIS — I10 Essential (primary) hypertension: Secondary | ICD-10-CM | POA: Diagnosis present

## 2015-12-01 DIAGNOSIS — M549 Dorsalgia, unspecified: Secondary | ICD-10-CM

## 2015-12-01 DIAGNOSIS — B1920 Unspecified viral hepatitis C without hepatic coma: Secondary | ICD-10-CM | POA: Diagnosis present

## 2015-12-01 DIAGNOSIS — R634 Abnormal weight loss: Secondary | ICD-10-CM

## 2015-12-01 DIAGNOSIS — N390 Urinary tract infection, site not specified: Secondary | ICD-10-CM | POA: Diagnosis present

## 2015-12-01 LAB — URINALYSIS COMPLETE WITH MICROSCOPIC (ARMC ONLY)
Bilirubin Urine: NEGATIVE
Glucose, UA: 50 mg/dL — AB
Ketones, ur: NEGATIVE mg/dL
Nitrite: NEGATIVE
Protein, ur: 30 mg/dL — AB
Specific Gravity, Urine: 1.013 (ref 1.005–1.030)
pH: 6 (ref 5.0–8.0)

## 2015-12-01 LAB — GLUCOSE, CAPILLARY
Glucose-Capillary: 298 mg/dL — ABNORMAL HIGH (ref 65–99)
Glucose-Capillary: 170 mg/dL — ABNORMAL HIGH (ref 65–99)
Glucose-Capillary: 168 mg/dL — ABNORMAL HIGH (ref 65–99)

## 2015-12-01 LAB — HEPATIC FUNCTION PANEL
ALK PHOS: 77 U/L (ref 38–126)
ALT: 10 U/L — AB (ref 17–63)
AST: 15 U/L (ref 15–41)
Albumin: 2.8 g/dL — ABNORMAL LOW (ref 3.5–5.0)
BILIRUBIN DIRECT: 0.2 mg/dL (ref 0.1–0.5)
BILIRUBIN INDIRECT: 0.4 mg/dL (ref 0.3–0.9)
TOTAL PROTEIN: 6.4 g/dL — AB (ref 6.5–8.1)
Total Bilirubin: 0.6 mg/dL (ref 0.3–1.2)

## 2015-12-01 LAB — PROTIME-INR
INR: 1.06
Prothrombin Time: 14 seconds (ref 11.4–15.0)

## 2015-12-01 MED ORDER — FERROUS SULFATE 325 (65 FE) MG PO TABS
325.0000 mg | ORAL_TABLET | Freq: Every day | ORAL | Status: DC
  Administered 2015-12-01 – 2015-12-03 (×3): 325 mg via ORAL
  Filled 2015-12-01 (×3): qty 1

## 2015-12-01 MED ORDER — INSULIN ASPART 100 UNIT/ML ~~LOC~~ SOLN
3.0000 [IU] | Freq: Three times a day (TID) | SUBCUTANEOUS | Status: DC
  Administered 2015-12-01 – 2015-12-02 (×3): 3 [IU] via SUBCUTANEOUS
  Filled 2015-12-01 (×4): qty 3

## 2015-12-01 MED ORDER — MORPHINE SULFATE (PF) 2 MG/ML IV SOLN: 2.0000 mg | INTRAVENOUS | Status: DC | PRN

## 2015-12-01 MED ORDER — DOCUSATE SODIUM 100 MG PO CAPS
100.0000 mg | ORAL_CAPSULE | Freq: Two times a day (BID) | ORAL | Status: DC
  Administered 2015-12-01 – 2015-12-03 (×5): 100 mg via ORAL
  Filled 2015-12-01 (×6): qty 1

## 2015-12-01 MED ORDER — DEXTROSE 5 % IV SOLN
1.0000 g | INTRAVENOUS | Status: DC
  Administered 2015-12-01 – 2015-12-02 (×2): 1 g via INTRAVENOUS
  Filled 2015-12-01 (×3): qty 10

## 2015-12-01 MED ORDER — HEPARIN SODIUM (PORCINE) 5000 UNIT/ML IJ SOLN
5000.0000 [IU] | Freq: Three times a day (TID) | INTRAMUSCULAR | Status: DC
  Administered 2015-12-01 – 2015-12-03 (×6): 5000 [IU] via SUBCUTANEOUS
  Filled 2015-12-01 (×6): qty 1

## 2015-12-01 MED ORDER — LORAZEPAM 1 MG PO TABS
1.0000 mg | ORAL_TABLET | Freq: Four times a day (QID) | ORAL | Status: DC | PRN
Start: 2015-12-01 — End: 2015-12-03
  Administered 2015-12-01 – 2015-12-02 (×2): 1 mg via ORAL
  Filled 2015-12-01 (×2): qty 1

## 2015-12-01 MED ORDER — NICOTINE 21 MG/24HR TD PT24
21.0000 mg | MEDICATED_PATCH | Freq: Every day | TRANSDERMAL | Status: DC
  Administered 2015-12-02: 21 mg via TRANSDERMAL
  Filled 2015-12-01 (×3): qty 1

## 2015-12-01 MED ORDER — ASPIRIN EC 81 MG PO TBEC
81.0000 mg | DELAYED_RELEASE_TABLET | Freq: Every day | ORAL | Status: DC
  Administered 2015-12-01 – 2015-12-03 (×3): 81 mg via ORAL
  Filled 2015-12-01 (×3): qty 1

## 2015-12-01 MED ORDER — LISINOPRIL 5 MG PO TABS
5.0000 mg | ORAL_TABLET | Freq: Every day | ORAL | Status: DC
  Administered 2015-12-01: 5 mg via ORAL
  Filled 2015-12-01: qty 1

## 2015-12-01 MED ORDER — SODIUM CHLORIDE 0.9 % IV SOLN
INTRAVENOUS | Status: DC
  Administered 2015-12-01 – 2015-12-02 (×3): via INTRAVENOUS

## 2015-12-01 MED ORDER — ENSURE ENLIVE PO LIQD
237.0000 mL | Freq: Three times a day (TID) | ORAL | Status: DC
  Administered 2015-12-02 – 2015-12-03 (×5): 237 mL via ORAL

## 2015-12-01 MED ORDER — PANTOPRAZOLE SODIUM 40 MG PO TBEC
40.0000 mg | DELAYED_RELEASE_TABLET | Freq: Every day | ORAL | Status: DC
  Administered 2015-12-01 – 2015-12-03 (×3): 40 mg via ORAL
  Filled 2015-12-01 (×3): qty 1

## 2015-12-01 MED ORDER — INSULIN ASPART 100 UNIT/ML ~~LOC~~ SOLN
0.0000 [IU] | Freq: Three times a day (TID) | SUBCUTANEOUS | Status: DC
  Administered 2015-12-02 (×2): 2 [IU] via SUBCUTANEOUS
  Administered 2015-12-03: 7 [IU] via SUBCUTANEOUS
  Filled 2015-12-01 (×2): qty 2
  Filled 2015-12-01: qty 7

## 2015-12-01 NOTE — Progress Notes (Signed)
Pts daughter request something for agitation. MD notified. Orders received. Will continue to assess.

## 2015-12-01 NOTE — Progress Notes (Signed)
Admission skin assessment with Shanon Brow, RN

## 2015-12-01 NOTE — Progress Notes (Addendum)
Inpatient Diabetes Program Recommendations  AACE/ADA: New Consensus Statement on Inpatient Glycemic Control (2015)  Target Ranges:  Prepandial:   less than 140 mg/dL      Peak postprandial:   less than 180 mg/dL (1-2 hours)      Critically ill patients:  140 - 180 mg/dL    Results for Gerald Molina, Gerald Molina (MRN [id number]) as of 12/01/2015 09:19  Ref. Range 11/30/2015 22:55  Glucose-Capillary Latest Ref Range: 65-99 mg/dL 298 (H)    Admit with: Weakness/ Acute Kidney Injury/ Pancreatic Mass  History: DM  Home DM Meds: Glyburide 5 mg daily       Metformin 500 mg bid  Current Insulin Orders: None yet      MD- Please consider placing orders for Novolog Sensitive SSI (0-9 units) Q4 hours while NPO (can change to TID AC + HS if patient will be placed on diet)  Please use the Glycemic Control Order set to order Novolog SSI     --Will follow patient during hospitalization--  Wyn Quaker RN, MSN, CDE Diabetes Coordinator Inpatient Glycemic Control Team Team Pager: [phone] (8a-5p)

## 2015-12-01 NOTE — Progress Notes (Signed)
[place] [facility] [place] NAME: Gerald Molina    MR#:  [id number]  DATE OF BIRTH:  [birth year]  SUBJECTIVE: 81 year old male patient admitted because of nausea, vomiting, abdominal pain. Recently seen today. Denies any nausea, vomiting or abdominal pain today. CT of the abdomen concerning for pancreatic mass. Discussed this with patient's daughters and they are really concerned because patient had a lot of tests done at the [place] and they are they were not aware of any cancer. Patient daughters had a lot of questions for me and also possible oncology consult when it will be done.   CHIEF COMPLAINT:   Chief Complaint  Patient presents with  . Weakness    REVIEW OF SYSTEMS:   ROS CONSTITUTIONAL: No fever, fatigue or weakness.  EYES: No blurred or double vision.  EARS, NOSE, AND THROAT: No tinnitus or ear pain.  RESPIRATORY: No cough, shortness of breath, wheezing or hemoptysis.  CARDIOVASCULAR: No chest pain, orthopnea, edema.  GASTROINTESTINAL: No nausea, vomiting, diarrhea or abdominal pain.  GENITOURINARY: No dysuria, hematuria.  ENDOCRINE: No polyuria, nocturia,  HEMATOLOGY: No anemia, easy bruising or bleeding SKIN: No rash or lesion. MUSCULOSKELETAL: No joint pain or arthritis.   NEUROLOGIC: No tingling, numbness, weakness.  PSYCHIATRY: No anxiety or depression.   DRUG ALLERGIES:  No Known Allergies  VITALS:  Blood pressure 119/71, pulse 59, temperature 97.9 F (36.6 C), temperature source Oral, resp. rate 18, height 6' (1.829 m), weight 54.522 kg (120 lb 3.2 oz), SpO2 100 %.  PHYSICAL EXAMINATION:  GENERAL:  81 y.o.-year-old patient lying in the bed with no acute distress.  EYES: Pupils equal, round, reactive to light and accommodation. No scleral icterus. Extraocular muscles intact.  HEENT: Head atraumatic, normocephalic. Oropharynx and nasopharynx clear.  NECK:  Supple, no jugular venous distention. No thyroid enlargement, no  tenderness.  LUNGS: Normal breath sounds bilaterally, no wheezing, rales,rhonchi or crepitation. No use of accessory muscles of respiration.  CARDIOVASCULAR: S1, S2 normal. No murmurs, rubs, or gallops.  ABDOMEN: Soft, nontender, nondistended. Bowel sounds present. No organomegaly or mass.  EXTREMITIES: No pedal edema, cyanosis, or clubbing.  NEUROLOGIC: Cranial nerves II through XII are intact. Muscle strength 5/5 in all extremities. Sensation intact. Gait not checked.  PSYCHIATRIC: The patient is alert and oriented x 3.  SKIN: No obvious rash, lesion, or ulcer.    LABORATORY PANEL:   CBC  Recent Labs Lab 11/30/15 2301  WBC 7.2  HGB 12.0*  HCT 36.6*  PLT 190   ------------------------------------------------------------------------------------------------------------------  Chemistries   Recent Labs Lab 11/30/15 2301  NA 133*  K 4.1  CL 84*  CO2 39*  GLUCOSE 318*  BUN 43*  CREATININE 2.66*  CALCIUM 9.2   ------------------------------------------------------------------------------------------------------------------  Cardiac Enzymes No results for input(s): TROPONINI in the last 168 hours. ------------------------------------------------------------------------------------------------------------------  RADIOLOGY:  Ct Abdomen Pelvis Wo Contrast  12/01/2015  CLINICAL DATA:  Generalized abdominal discomfort. Weakness, nausea and vomiting for 1 week. EXAM: CT ABDOMEN AND PELVIS WITHOUT CONTRAST TECHNIQUE: Multidetector CT imaging of the abdomen and pelvis was performed following the standard protocol without IV contrast. COMPARISON:  None. FINDINGS: Lower chest: Mild dependent atelectasis in both lung bases. Coronary artery calcifications are seen. Liver: There is a 1.8 cm subcapsular hypodensity adjacent to the falciform ligament. Small hypodensity in the inferior right lower lobe. Slight nodular hepatic contours. Hepatobiliary: Gallbladder filled with stones. No gross  biliary dilatation. Pancreas: Rounded masslike density in the region of the pancreatic head/ neck. This  is suboptimally defined without contrast however measures at least 3.9 cm. This abuts the fourth portion of the duodenum and likely mesenteric vessels. There is an additional ill-defined soft tissue density in the region of the porta hepatis. There is no pancreatic ductal dilatation, pancreatic duct measures 2 mm in the body. Spleen: Normal. Adrenal glands: Left adrenal nodularity measuring 1.3 cm. Kidneys: Calcifications at the renal hila, likely vascular. No hydronephrosis. No perinephric stranding. Stomach/Bowel: Small hiatal hernia with distal esophageal thickening and thickening of the gastroesophageal junction. No bowel obstruction. There are no dilated or thickened small bowel loops. Motor volume of stool throughout the colon without colonic wall thickening. Mild distal diverticulosis without diverticulitis The appendix is normal. Vascular/Lymphatic: No retroperitoneal adenopathy. Abdominal aorta is normal in caliber. Dense atherosclerosis without aneurysm. Reproductive: Heterogeneous enlargement of prostate gland causing mass effect on the bladder base. Prostate gland measures 5.8 cm cranial caudal dimension. Bladder: Physiologically distended, bladder diverticulum noted at the dome. Other: No free air, free fluid, or intra-abdominal fluid collection. Minimal soft tissue density in the right inguinal canal. Musculoskeletal: Near complete disc space loss at L3-L4 with adjacent endplate irregularity, endplate sclerosis and possible erosions. There is sclerosis in ill-defined lucencies throughout the L3 vertebral body, with similar appearance extending into the posterior elements bilaterally. IMPRESSION: 1. Soft tissue density in the region of the pancreatic head, incompletely characterized without contrast, however concerning for malignancy. No pancreatic ductal dilatation. Additional soft tissue density in  the region the porta hepatis may reflect adenopathy. 2. Small ill-defined liver lesion, in this setting concerning for metastatic disease. Additional lesion adjacent the falciform ligament may reflect fatty infiltration versus additional lesion. Findings incompletely characterized without contrast per 3. Abnormal appearance of L3-L4 in the lumbar spine is nonspecific. Paget's disease of L3 is considered, however given the adjacent irregularity of superior endplate of L4, sequela of remote infection could have a similar appearance. No paravertebral stranding or soft tissue abnormality to suggest acute infection at this time. Metastatic disease not excluded. 4. Chronic findings include cholelithiasis, enlarged prostate gland causing mass effect on the bladder base, diverticulosis, and dense atherosclerosis. 5. Nonspecific nodularity of the left adrenal gland. Please note no comparison exams are available, and lack of intravenous contrast limits evaluation of the solid viscera. Electronically Signed   By: Jeb Levering M.D.   On: 12/01/2015 02:08   Dg Chest Portable 1 View  12/01/2015  CLINICAL DATA:  Dizziness, coughing and vomiting for 1 week EXAM: PORTABLE CHEST 1 VIEW COMPARISON:  02/25/2013 FINDINGS: Normal mediastinum and cardiac silhouette. Normal pulmonary vasculature. No evidence of effusion, infiltrate, or pneumothorax. No acute bony abnormality. IMPRESSION: No acute cardiopulmonary process. Electronically Signed   By: Suzy Bouchard M.D.   On: 12/01/2015 00:07    EKG:   Orders placed or performed during the hospital encounter of 11/30/15  . ED EKG  . ED EKG  . EKG 12-Lead  . EKG 12-Lead    ASSESSMENT AND PLAN:   #1 abdominal pain nausea vomiting: Likely secondary to dehydration symptoms improved. Continue IV hydration, start clear liquid diet. #2 abdominal pain secondary to  pancreatic mass with liver metastases. Patient needs oncology consult, we'll order tumor markers with the CA-19-9  and CEA levels. Also obtain the medical records from [place] #3 acute kidney injury secondary to ATN on also nausea and vomiting: Continue IV hydration, that her kidney function closely. #4 diabetes mellitus type 2; hold metformin secondary to renal failure, continue sliding scale coverage, will consider starting  Glyburide from  tomorrow. 5, UTI: Started Rocephin, follow urine cultures. D/W DAUGHTERS  All the records are reviewed and case discussed with Care Management/Social Workerr. Management plans discussed with the patient, family and they are in agreement.  CODE STATUS: Full  TOTAL TIME TAKING CARE OF THIS PATIENT: 26minutes.   POSSIBLE D/C IN 1-2 DAYS, DEPENDING ON CLINICAL CONDITION.   Epifanio Lesches M.D on 12/01/2015 at 12:27 PM  Between 7am to 6pm - Pager - [phone]  After 6pm go to [email or web address] - password [email or web address] [facility]  [place] [facility]  Office  [phone]  CC: Primary care physician; No primary care provider on file.   Note: This dictation was prepared with Dragon dictation along with smaller phrase technology. Any transcriptional errors that result from this process are unintentional.

## 2015-12-01 NOTE — Progress Notes (Addendum)
Initial Nutrition Assessment  DOCUMENTATION CODES:   Severe malnutrition in context of acute illness/injury  INTERVENTION:  Meals and snacks: Cater to pt preferences, paged Dr Vianne Bulls regarding diet progression. Oncology into see pt and may advance diet if not planning further testing Medical Nutrition Supplement Therapy: Would recommend ensure enlive TID once diet progressed.   Coordination of care: Dtr wanting this Probation officer to speak with Care Management regarding Meals on Wheels sign up.  Nann informed.   NUTRITION DIAGNOSIS:   Inadequate oral intake related to acute illness as evidenced by per patient/family report.    GOAL:   Patient will meet greater than or equal to 90% of their needs    MONITOR:    (Energy intake, Glucose profile, Digestive system)  REASON FOR ASSESSMENT:   Diagnosis    ASSESSMENT:      Pt admitted with acute kidney injury found to have pancreatic mass.  Oncology consulted to see pt  Past Medical History  Diagnosis Date  . DM type 2 (diabetes mellitus, type 2) ([place])   . Gastric ulcer   . Hepatitis C   . Arthritis     Current Nutrition: NPO  Food/Nutrition-Related History: pt reports poor po intake for about 1 week prior to admission secondary to nausea, vomiting   Scheduled Medications:  . aspirin EC  81 mg Oral Daily  . cefTRIAXone (ROCEPHIN)  IV  1 g Intravenous Q24H  . docusate sodium  100 mg Oral BID  . ferrous sulfate  325 mg Oral Q breakfast  . heparin  5,000 Units Subcutaneous 3 times per day  . insulin aspart  0-9 Units Subcutaneous TID WC  . insulin aspart  3 Units Subcutaneous TID WC  . nicotine  21 mg Transdermal Daily  . pantoprazole  40 mg Oral QAC breakfast    Continuous Medications:  . sodium chloride 100 mL/hr at 12/01/15 0644     Electrolyte/Renal Profile and Glucose Profile:   Recent Labs Lab 11/30/15 2301  NA 133*  K 4.1  CL 84*  CO2 39*  BUN 43*  CREATININE 2.66*  CALCIUM 9.2  GLUCOSE 318*     Gastrointestinal Profile: Last BM: 1/3   Nutrition-Focused Physical Exam Findings: Nutrition-Focused physical exam completed. Findings are moderate - severe  fat depletion, moderate to severe muscle depletion, and no edema.      Weight Change: wt loss per dtr unable to clarify amount and time frame at this time.  Dtr frustrated not on diet yet and waiting on oncology    Diet Order:  Diet NPO time specified Except for: Sips with Meds  Skin:   reviewed   Height:   Ht Readings from Last 1 Encounters:  11/30/15 6' (1.829 m)    Weight:   Wt Readings from Last 1 Encounters:  12/01/15 120 lb 3.2 oz (54.522 kg)    Ideal Body Weight:     BMI:  Body mass index is 16.3 kg/(m^2).  Estimated Nutritional Needs:   Kcal:  BEE 1568 kcals (IF 1.0-1.3, AF 1.3) 2038 -2649 kcals/d Using IBW of 81kg  Protein:  (1.0-1.3 g/kg) 81-105 g/d   Fluid:  (25-37ml/kg) 2025-2435ml/d  EDUCATION NEEDS:   No education needs identified at this time  HIGH Care Level  Shivansh Hardaway B. Zenia Resides, [place], [place] (pager) Weekend/On-Call pager [phone])

## 2015-12-01 NOTE — ED Notes (Signed)
Pt unable to provide Urine at this time.

## 2015-12-01 NOTE — Consult Note (Signed)
[place]  Telephone:([phone]) [phone]  Fax:([phone]) [phone]     Gerald Molina DOB: 06-10-1935  MR#: [id number]  OBS#:[id number]  No care team member to display  CHIEF COMPLAINT:  Chief Complaint  Patient presents with  . Weakness    INTERVAL HISTORY:  Patient presented to the ED with generalized weakness, nausea, some vomiting, and abdominal pain. CT scan of abdomen was performed in ED and revealed a pancreatic head mass measuring approximately 3.9cm. There is also an hypodensity in that measures 1.8cm in liver, nonspecific findings in the L3-4 region, and a nonspecific adrenal nodularity. Patient and daughter report extensive workup at [facility]. At this time records have not yet been received. He states that his nausea and abdominal discomfort are gone now and is requesting something light to eat.   REVIEW OF SYSTEMS:   Review of Systems  Constitutional: Positive for weight loss and malaise/fatigue. Negative for fever, chills and diaphoresis.  HENT: Negative.   Eyes: Positive for redness.  Respiratory: Negative for cough, hemoptysis, sputum production, shortness of breath and wheezing.   Cardiovascular: Negative for chest pain, palpitations, orthopnea, claudication, leg swelling and PND.  Gastrointestinal: Positive for nausea, vomiting and abdominal pain. Negative for heartburn, diarrhea, constipation, blood in stool and melena.  Genitourinary: Negative.   Musculoskeletal: Positive for back pain.  Skin: Negative.   Neurological: Positive for weakness. Negative for dizziness, tingling, focal weakness and seizures.  Endo/Heme/Allergies: Does not bruise/bleed easily.  Psychiatric/Behavioral: Negative for depression. The patient is not nervous/anxious and does not have insomnia.     As per HPI. Otherwise, a complete review of systems is negatve.   PAST MEDICAL HISTORY: Past Medical History  Diagnosis Date  . DM type 2 (diabetes mellitus, type 2) ([place])   .  Gastric ulcer   . Hepatitis C   . Arthritis     PAST SURGICAL HISTORY: Past Surgical History  Procedure Laterality Date  . None      FAMILY HISTORY Family History  Problem Relation Age of Onset  . Hypertension    . Diabetes type II      GYNECOLOGIC HISTORY:  No LMP for male patient.     ADVANCED DIRECTIVES:    HEALTH MAINTENANCE: Social History  Substance Use Topics  . Smoking status: Current Every Day Smoker -- 1.00 packs/day    Types: Cigarettes  . Smokeless tobacco: Not on file  . Alcohol Use: No     Comment: former drinker    No Known Allergies  Current Facility-Administered Medications  Medication Dose Route Frequency Provider Last Rate Last Dose  . 0.9 %  sodium chloride infusion   Intravenous Continuous Harrie Foreman, MD 100 mL/hr at 12/01/15 [phone]    . aspirin EC tablet 81 mg  81 mg Oral Daily Harrie Foreman, MD   81 mg at 12/01/15 1139  . cefTRIAXone (ROCEPHIN) 1 g in dextrose 5 % 50 mL IVPB  1 g Intravenous Q24H Epifanio Lesches, MD   1 g at 12/01/15 1418  . docusate sodium (COLACE) capsule 100 mg  100 mg Oral BID Harrie Foreman, MD   100 mg at 12/01/15 1138  . feeding supplement (ENSURE ENLIVE) (ENSURE ENLIVE) liquid 237 mL  237 mL Oral TID Epifanio Lesches, MD      . ferrous sulfate tablet 325 mg  325 mg Oral Q breakfast Harrie Foreman, MD   325 mg at 12/01/15 1137  . heparin injection 5,000 Units  5,000 Units Subcutaneous  3 times per day Harrie Foreman, MD   5,000 Units at 12/01/15 1400  . insulin aspart (novoLOG) injection 0-9 Units  0-9 Units Subcutaneous TID WC Epifanio Lesches, MD      . insulin aspart (novoLOG) injection 3 Units  3 Units Subcutaneous TID WC Epifanio Lesches, MD      . LORazepam (ATIVAN) tablet 1 mg  1 mg Oral Q6H PRN Epifanio Lesches, MD      . morphine 2 MG/ML injection 2 mg  2 mg Intravenous Q4H PRN Harrie Foreman, MD      . nicotine (NICODERM CQ - dosed in mg/24 hours) patch 21 mg  21 mg  Transdermal Daily Harrie Foreman, MD   21 mg at 12/01/15 1139  . pantoprazole (PROTONIX) EC tablet 40 mg  40 mg Oral QAC breakfast Harrie Foreman, MD   40 mg at 12/01/15 1138    OBJECTIVE: BP 119/71 mmHg  Pulse 59  Temp(Src) 97.9 F (36.6 C) (Oral)  Resp 18  Ht 6' (1.829 m)  Wt 120 lb 3.2 oz (54.522 kg)  BMI 16.30 kg/m2  SpO2 100%   Body mass index is 16.3 kg/(m^2).    ECOG FS:2 - Symptomatic, <50% confined to bed  General: Thin, ill appearing elderly male, no acute distress. Eyes: Pink conjunctiva, anicteric sclera. HEENT: Normocephalic, moist mucous membranes, clear oropharnyx. Lungs: Clear to auscultation bilaterally. Heart: Regular rate and rhythm. No rubs, murmurs, or gallops. Abdomen: Soft, nontender, nondistended. No organomegaly noted, normoactive bowel sounds. Breast: Breast palpated in a circular manner in the sitting and supine positions.  No masses or fullness palpated.  Axilla palpated in both positions with no masses or fullness palpated.  Musculoskeletal: No edema, cyanosis, or clubbing. Neuro: Alert, answering all questions appropriately. Cranial nerves grossly intact. Skin: No rashes or petechiae noted. Psych: Normal affect. Lymphatics: No cervical, calvicular, axillary or inguinal LAD.   LAB RESULTS:  Admission on 11/30/2015  Component Date Value Ref Range Status  . Sodium 11/30/2015 133* 135 - 145 mmol/L Final  . Potassium 11/30/2015 4.1  3.5 - 5.1 mmol/L Final  . Chloride 11/30/2015 84* 101 - 111 mmol/L Final  . CO2 11/30/2015 39* 22 - 32 mmol/L Final  . Glucose, Bld 11/30/2015 318* 65 - 99 mg/dL Final  . BUN 11/30/2015 43* 6 - 20 mg/dL Final  . Creatinine, Ser 11/30/2015 2.66* 0.61 - 1.24 mg/dL Final  . Calcium 11/30/2015 9.2  8.9 - 10.3 mg/dL Final  . GFR calc non Af Amer 11/30/2015 21* >60 mL/min Final  . GFR calc Af Amer 11/30/2015 25* >60 mL/min Final   Comment: (NOTE) The eGFR has been calculated using the CKD EPI equation. This calculation  has not been validated in all clinical situations. eGFR's persistently <60 mL/min signify possible Chronic Kidney Disease.   . Anion gap 11/30/2015 10  5 - 15 Final  . WBC 11/30/2015 7.2  3.8 - 10.6 K/uL Final  . RBC 11/30/2015 4.35* 4.40 - 5.90 MIL/uL Final  . Hemoglobin 11/30/2015 12.0* 13.0 - 18.0 g/dL Final  . HCT 11/30/2015 36.6* 40.0 - 52.0 % Final  . MCV 11/30/2015 84.1  80.0 - 100.0 fL Final  . MCH 11/30/2015 27.7  26.0 - 34.0 pg Final  . MCHC 11/30/2015 32.9  32.0 - 36.0 g/dL Final  . RDW 11/30/2015 15.6* 11.5 - 14.5 % Final  . Platelets 11/30/2015 190  150 - 440 K/uL Final  . Color, Urine 12/01/2015 YELLOW* YELLOW Final  . APPearance 12/01/2015  HAZY* CLEAR Final  . Glucose, UA 12/01/2015 50* NEGATIVE mg/dL Final  . Bilirubin Urine 12/01/2015 NEGATIVE  NEGATIVE Final  . Ketones, ur 12/01/2015 NEGATIVE  NEGATIVE mg/dL Final  . Specific Gravity, Urine 12/01/2015 1.013  1.005 - 1.030 Final  . Hgb urine dipstick 12/01/2015 1+* NEGATIVE Final  . pH 12/01/2015 6.0  5.0 - 8.0 Final  . Protein, ur 12/01/2015 30* NEGATIVE mg/dL Final  . Nitrite 12/01/2015 NEGATIVE  NEGATIVE Final  . Leukocytes, UA 12/01/2015 3+* NEGATIVE Final  . RBC / HPF 12/01/2015 6-30  0 - 5 RBC/hpf Final  . WBC, UA 12/01/2015 TOO NUMEROUS TO COUNT  0 - 5 WBC/hpf Final  . Bacteria, UA 12/01/2015 RARE* NONE SEEN Final  . Squamous Epithelial / LPF 12/01/2015 0-5* NONE SEEN Final  . Mucous 12/01/2015 PRESENT   Final  . Glucose-Capillary 11/30/2015 298* 65 - 99 mg/dL Final  . Comment 1 11/30/2015 Notify RN   Final    STUDIES: Ct Abdomen Pelvis Wo Contrast  12/01/2015  CLINICAL DATA:  Generalized abdominal discomfort. Weakness, nausea and vomiting for 1 week. EXAM: CT ABDOMEN AND PELVIS WITHOUT CONTRAST TECHNIQUE: Multidetector CT imaging of the abdomen and pelvis was performed following the standard protocol without IV contrast. COMPARISON:  None. FINDINGS: Lower chest: Mild dependent atelectasis in both lung  bases. Coronary artery calcifications are seen. Liver: There is a 1.8 cm subcapsular hypodensity adjacent to the falciform ligament. Small hypodensity in the inferior right lower lobe. Slight nodular hepatic contours. Hepatobiliary: Gallbladder filled with stones. No gross biliary dilatation. Pancreas: Rounded masslike density in the region of the pancreatic head/ neck. This is suboptimally defined without contrast however measures at least 3.9 cm. This abuts the fourth portion of the duodenum and likely mesenteric vessels. There is an additional ill-defined soft tissue density in the region of the porta hepatis. There is no pancreatic ductal dilatation, pancreatic duct measures 2 mm in the body. Spleen: Normal. Adrenal glands: Left adrenal nodularity measuring 1.3 cm. Kidneys: Calcifications at the renal hila, likely vascular. No hydronephrosis. No perinephric stranding. Stomach/Bowel: Small hiatal hernia with distal esophageal thickening and thickening of the gastroesophageal junction. No bowel obstruction. There are no dilated or thickened small bowel loops. Motor volume of stool throughout the colon without colonic wall thickening. Mild distal diverticulosis without diverticulitis The appendix is normal. Vascular/Lymphatic: No retroperitoneal adenopathy. Abdominal aorta is normal in caliber. Dense atherosclerosis without aneurysm. Reproductive: Heterogeneous enlargement of prostate gland causing mass effect on the bladder base. Prostate gland measures 5.8 cm cranial caudal dimension. Bladder: Physiologically distended, bladder diverticulum noted at the dome. Other: No free air, free fluid, or intra-abdominal fluid collection. Minimal soft tissue density in the right inguinal canal. Musculoskeletal: Near complete disc space loss at L3-L4 with adjacent endplate irregularity, endplate sclerosis and possible erosions. There is sclerosis in ill-defined lucencies throughout the L3 vertebral body, with similar  appearance extending into the posterior elements bilaterally. IMPRESSION: 1. Soft tissue density in the region of the pancreatic head, incompletely characterized without contrast, however concerning for malignancy. No pancreatic ductal dilatation. Additional soft tissue density in the region the porta hepatis may reflect adenopathy. 2. Small ill-defined liver lesion, in this setting concerning for metastatic disease. Additional lesion adjacent the falciform ligament may reflect fatty infiltration versus additional lesion. Findings incompletely characterized without contrast per 3. Abnormal appearance of L3-L4 in the lumbar spine is nonspecific. Paget's disease of L3 is considered, however given the adjacent irregularity of superior endplate of L4, sequela of remote infection  could have a similar appearance. No paravertebral stranding or soft tissue abnormality to suggest acute infection at this time. Metastatic disease not excluded. 4. Chronic findings include cholelithiasis, enlarged prostate gland causing mass effect on the bladder base, diverticulosis, and dense atherosclerosis. 5. Nonspecific nodularity of the left adrenal gland. Please note no comparison exams are available, and lack of intravenous contrast limits evaluation of the solid viscera. Electronically Signed   By: Jeb Levering M.D.   On: 12/01/2015 02:08   Dg Chest Portable 1 View  12/01/2015  CLINICAL DATA:  Dizziness, coughing and vomiting for 1 week EXAM: PORTABLE CHEST 1 VIEW COMPARISON:  02/25/2013 FINDINGS: Normal mediastinum and cardiac silhouette. Normal pulmonary vasculature. No evidence of effusion, infiltrate, or pneumothorax. No acute bony abnormality. IMPRESSION: No acute cardiopulmonary process. Electronically Signed   By: Suzy Bouchard M.D.   On: 12/01/2015 00:07    ASSESSMENT/ PLAN: 1. Pancreatic mass. Will obtain records and review when possible regarding extensive workup at [facility]. Lack of IV contrast makes CT scan  images unclear. Discussed with Dr. Massie Bougie as well as Dr. Candace Cruise, will plan for an ERCP for biopsy of pancreatic mass. Will obtain Ca 19-9 and CEA. Hepatic panel and protime will also be checked.  GI consult entered. Will advance diet to carb controlled, soft diet as patient is requesting food and is no longer nauseous.  Discussed at length with patient and daughters, Nelle Don [phone] and Lupita Dawn [phone]. If daughters are not available during meeting with Gerald Molina please call at above numbers.   Will continue to follow.  Dr. Grayland Ormond was available for consultation and review of plan of care for this patient.  Evlyn Kanner, NP   12/01/2015 5:52 PM

## 2015-12-01 NOTE — ED Notes (Signed)
Patient transported to CT 

## 2015-12-01 NOTE — H&P (Addendum)
Gerald Molina is an 81 y.o. male.   Chief Complaint: Weakness HPI: The patient presents to the emergency department complaining of generalized weakness. He admits that he hasn't had nausea and vomiting for 4 days. Emesis is nonbloody and nonbilious. He denies diarrhea or fevers. He also denies falls. The patient also denies night sweats but admits that he had some cold sweats the other day. Due to abdominal pain the emergency department physician obtained a CT of the abdomen which revealed a pancreatic mass is well as potential metastasis to the liver. Laboratory results also revealed acute kidney injury which prompted the emergency department staff to call for admission.  No past medical history on file. Hypertension (records at the [place])  No past surgical history on file. Not available (records at the [place])  No family history on file. Hypertension Social History:  has no tobacco, alcohol, and drug history on file.  Allergies: No Known Allergies  Prior to Admission medications   Medication Sig Start Date End Date Taking? Authorizing Provider  aspirin EC 81 MG tablet Take 81 mg by mouth daily.   Yes Historical Provider, MD  ferrous sulfate 325 (65 FE) MG tablet Take 325 mg by mouth daily with breakfast.   Yes Historical Provider, MD  glyBURIDE (DIABETA) 5 MG tablet Take 5 mg by mouth daily with breakfast.   Yes Historical Provider, MD  lisinopril (PRINIVIL,ZESTRIL) 5 MG tablet Take 5 mg by mouth daily.   Yes Historical Provider, MD  metFORMIN (GLUCOPHAGE) 500 MG tablet Take 500 mg by mouth 2 (two) times daily with a meal.   Yes Historical Provider, MD  pantoprazole (PROTONIX) 40 MG tablet Take 40 mg by mouth daily.   Yes Historical Provider, MD     Results for orders placed or performed during the hospital encounter of 11/30/15 (from the past 48 hour(s))  Glucose, capillary     Status: Abnormal   Collection Time: 11/30/15 10:55 PM  Result Value Ref Range   Glucose-Capillary 298 (H) 65 - 99  mg/dL   Comment 1 Notify RN   Basic metabolic panel     Status: Abnormal   Collection Time: 11/30/15 11:01 PM  Result Value Ref Range   Sodium 133 (L) 135 - 145 mmol/L   Potassium 4.1 3.5 - 5.1 mmol/L   Chloride 84 (L) 101 - 111 mmol/L   CO2 39 (H) 22 - 32 mmol/L   Glucose, Bld 318 (H) 65 - 99 mg/dL   BUN 43 (H) 6 - 20 mg/dL   Creatinine, Ser 2.66 (H) 0.61 - 1.24 mg/dL   Calcium 9.2 8.9 - 10.3 mg/dL   GFR calc non Af Amer 21 (L) >60 mL/min   GFR calc Af Amer 25 (L) >60 mL/min    Comment: (NOTE) The eGFR has been calculated using the CKD EPI equation. This calculation has not been validated in all clinical situations. eGFR's persistently <60 mL/min signify possible Chronic Kidney Disease.    Anion gap 10 5 - 15  CBC     Status: Abnormal   Collection Time: 11/30/15 11:01 PM  Result Value Ref Range   WBC 7.2 3.8 - 10.6 K/uL   RBC 4.35 (L) 4.40 - 5.90 MIL/uL   Hemoglobin 12.0 (L) 13.0 - 18.0 g/dL   HCT 36.6 (L) 40.0 - 52.0 %   MCV 84.1 80.0 - 100.0 fL   MCH 27.7 26.0 - 34.0 pg   MCHC 32.9 32.0 - 36.0 g/dL   RDW 15.6 (H) 11.5 -  14.5 %   Platelets 190 150 - 440 K/uL   Ct Abdomen Pelvis Wo Contrast  12/01/2015  CLINICAL DATA:  Generalized abdominal discomfort. Weakness, nausea and vomiting for 1 week. EXAM: CT ABDOMEN AND PELVIS WITHOUT CONTRAST TECHNIQUE: Multidetector CT imaging of the abdomen and pelvis was performed following the standard protocol without IV contrast. COMPARISON:  None. FINDINGS: Lower chest: Mild dependent atelectasis in both lung bases. Coronary artery calcifications are seen. Liver: There is a 1.8 cm subcapsular hypodensity adjacent to the falciform ligament. Small hypodensity in the inferior right lower lobe. Slight nodular hepatic contours. Hepatobiliary: Gallbladder filled with stones. No gross biliary dilatation. Pancreas: Rounded masslike density in the region of the pancreatic head/ neck. This is suboptimally defined without contrast however measures at least  3.9 cm. This abuts the fourth portion of the duodenum and likely mesenteric vessels. There is an additional ill-defined soft tissue density in the region of the porta hepatis. There is no pancreatic ductal dilatation, pancreatic duct measures 2 mm in the body. Spleen: Normal. Adrenal glands: Left adrenal nodularity measuring 1.3 cm. Kidneys: Calcifications at the renal hila, likely vascular. No hydronephrosis. No perinephric stranding. Stomach/Bowel: Small hiatal hernia with distal esophageal thickening and thickening of the gastroesophageal junction. No bowel obstruction. There are no dilated or thickened small bowel loops. Motor volume of stool throughout the colon without colonic wall thickening. Mild distal diverticulosis without diverticulitis The appendix is normal. Vascular/Lymphatic: No retroperitoneal adenopathy. Abdominal aorta is normal in caliber. Dense atherosclerosis without aneurysm. Reproductive: Heterogeneous enlargement of prostate gland causing mass effect on the bladder base. Prostate gland measures 5.8 cm cranial caudal dimension. Bladder: Physiologically distended, bladder diverticulum noted at the dome. Other: No free air, free fluid, or intra-abdominal fluid collection. Minimal soft tissue density in the right inguinal canal. Musculoskeletal: Near complete disc space loss at L3-L4 with adjacent endplate irregularity, endplate sclerosis and possible erosions. There is sclerosis in ill-defined lucencies throughout the L3 vertebral body, with similar appearance extending into the posterior elements bilaterally. IMPRESSION: 1. Soft tissue density in the region of the pancreatic head, incompletely characterized without contrast, however concerning for malignancy. No pancreatic ductal dilatation. Additional soft tissue density in the region the porta hepatis may reflect adenopathy. 2. Small ill-defined liver lesion, in this setting concerning for metastatic disease. Additional lesion adjacent the  falciform ligament may reflect fatty infiltration versus additional lesion. Findings incompletely characterized without contrast per 3. Abnormal appearance of L3-L4 in the lumbar spine is nonspecific. Paget's disease of L3 is considered, however given the adjacent irregularity of superior endplate of L4, sequela of remote infection could have a similar appearance. No paravertebral stranding or soft tissue abnormality to suggest acute infection at this time. Metastatic disease not excluded. 4. Chronic findings include cholelithiasis, enlarged prostate gland causing mass effect on the bladder base, diverticulosis, and dense atherosclerosis. 5. Nonspecific nodularity of the left adrenal gland. Please note no comparison exams are available, and lack of intravenous contrast limits evaluation of the solid viscera. Electronically Signed   By: Jeb Levering M.D.   On: 12/01/2015 02:08   Dg Chest Portable 1 View  12/01/2015  CLINICAL DATA:  Dizziness, coughing and vomiting for 1 week EXAM: PORTABLE CHEST 1 VIEW COMPARISON:  02/25/2013 FINDINGS: Normal mediastinum and cardiac silhouette. Normal pulmonary vasculature. No evidence of effusion, infiltrate, or pneumothorax. No acute bony abnormality. IMPRESSION: No acute cardiopulmonary process. Electronically Signed   By: Suzy Bouchard M.D.   On: 12/01/2015 00:07    Review of Systems  Constitutional: Negative for fever and chills.  HENT: Negative for sore throat and tinnitus.   Eyes: Negative for blurred vision and redness.  Respiratory: Negative for cough and shortness of breath.   Cardiovascular: Negative for chest pain, palpitations, orthopnea and PND.  Gastrointestinal: Positive for nausea and vomiting. Negative for abdominal pain and diarrhea.  Genitourinary: Negative for dysuria, urgency and frequency.  Musculoskeletal: Negative for myalgias and joint pain.  Skin: Negative for rash.       No lesions  Neurological: Positive for weakness. Negative for  speech change and focal weakness.  Endo/Heme/Allergies: Does not bruise/bleed easily.       No temperature intolerance  Psychiatric/Behavioral: Negative for depression and suicidal ideas.    Blood pressure 104/65, pulse 60, temperature 97.8 F (36.6 C), temperature source Oral, resp. rate 18, height 6' (1.829 m), weight 56.7 kg (125 lb), SpO2 100 %. Physical Exam   Assessment/Plan This is a 81 year old male admitted for acute kidney injury as well as a new finding of pancreatic mass with likely metastasis to the liver. Past medical history is significant for chronic liver disease. 1. Acute kidney injury: Likely secondary to dehydration. We'll hydrate the patient with intravenous fluid and try to avoid nephrotoxic drugs. 2. Pancreatic mass: Also with likely metastasis to the liver. Oncology consult per primary inpatient team. 3. Hypertension: Continue lisinopril 4. Diabetes mellitus type 2: Hold oral hypoglycemics. Sliding scale insulin while hospitalized. 5. Tobacco abuse: NicoDerm patch 6. DVT prophylaxis: Heparin 7. GI prophylaxis: Pantoprazole per home regimen The patient is a full code. Time spent on admission orders and patient care approximately 45 minutes  Harrie Foreman 12/01/2015, 2:24 AM

## 2015-12-02 ENCOUNTER — Telehealth: Payer: Self-pay

## 2015-12-02 LAB — GLUCOSE, CAPILLARY
Glucose-Capillary: 120 mg/dL — ABNORMAL HIGH (ref 65–99)
Glucose-Capillary: 179 mg/dL — ABNORMAL HIGH (ref 65–99)
Glucose-Capillary: 54 mg/dL — ABNORMAL LOW (ref 65–99)
Glucose-Capillary: 97 mg/dL (ref 65–99)

## 2015-12-02 LAB — BASIC METABOLIC PANEL
ANION GAP: 5 (ref 5–15)
BUN: 27 mg/dL — ABNORMAL HIGH (ref 6–20)
CALCIUM: 8.2 mg/dL — AB (ref 8.9–10.3)
CO2: 34 mmol/L — ABNORMAL HIGH (ref 22–32)
Chloride: 98 mmol/L — ABNORMAL LOW (ref 101–111)
Creatinine, Ser: 1.57 mg/dL — ABNORMAL HIGH (ref 0.61–1.24)
GFR, EST AFRICAN AMERICAN: 47 mL/min — AB (ref >60)
GFR, EST NON AFRICAN AMERICAN: 41 mL/min — AB (ref >60)
GLUCOSE: 217 mg/dL — AB (ref 65–99)
POTASSIUM: 3.7 mmol/L (ref 3.5–5.1)
Sodium: 137 mmol/L (ref 135–145)

## 2015-12-02 LAB — LIPASE, BLOOD: LIPASE: 46 U/L (ref 11–51)

## 2015-12-02 NOTE — Telephone Encounter (Signed)
  Oncology Nurse Navigator Documentation  Navigator Location: [facility] (12/02/15 1600) Navigator Encounter Type: Telephone (12/02/15 1600) Telephone: Outgoing Call (12/02/15 1600)           Treatment Phase: Abnormal Scans (12/02/15 1600) Barriers/Navigation Needs: Coordination of Care (12/02/15 1600)   Interventions: Coordination of Care (12/02/15 1600)   Coordination of Care: EUS (12/02/15 1600)                  Time Spent with Patient: 15 (12/02/15 1600)   Received request for EUS by Dr Candace Cruise. 12/16/15 verified for procedure. Per Magda Paganini NP family does not want to wait until then for procedure. Spoke with daughter Nelle Don on the phone. They would like to have EUS at [facility] as to not wait for 12/16/15 availability at [facility]. Magda Paganini NP is in agreeance and this will be arranged at [facility]. They will contact daughter Lupita Dawn with date/instructions.

## 2015-12-02 NOTE — Progress Notes (Signed)
[place] at [place] NAME: Gerald Molina    MR#:  [id number]  DATE OF BIRTH:  08-Jun-1935  SUBJECTIVE: 81 year old male patient admitted because of nausea, vomiting, abdominal pain. Recently seen today. Denies any nausea, vomiting or abdominal pain today. CT of the abdomen concerning for pancreatic mass. In by oncology, GI. Because LFTs are normal. GI is not planning to do any ERCP. Tumor markers are still pending. Patient feels better. No abdominal pain, no nausea, no vomiting.Marland Kitchen   CHIEF COMPLAINT:   Chief Complaint  Patient presents with  . Weakness    REVIEW OF SYSTEMS:   ROS CONSTITUTIONAL: No fever, fatigue or weakness.  EYES: No blurred or double vision.  EARS, NOSE, AND THROAT: No tinnitus or ear pain.  RESPIRATORY: No cough, shortness of breath, wheezing or hemoptysis.  CARDIOVASCULAR: No chest pain, orthopnea, edema.  GASTROINTESTINAL: No nausea, vomiting, diarrhea or abdominal pain.  GENITOURINARY: No dysuria, hematuria.  ENDOCRINE: No polyuria, nocturia,  HEMATOLOGY: No anemia, easy bruising or bleeding SKIN: No rash or lesion. MUSCULOSKELETAL: No joint pain or arthritis.   NEUROLOGIC: No tingling, numbness, weakness.  PSYCHIATRY: No anxiety or depression.   DRUG ALLERGIES:  No Known Allergies  VITALS:  Blood pressure 105/62, pulse 68, temperature 98.5 F (36.9 C), temperature source Oral, resp. rate 18, height 6' (1.829 m), weight 54.522 kg (120 lb 3.2 oz), SpO2 100 %.  PHYSICAL EXAMINATION:  GENERAL:  81 y.o.-year-old patient lying in the bed with no acute distress.  EYES: Pupils equal, round, reactive to light and accommodation. No scleral icterus. Extraocular muscles intact.  HEENT: Head atraumatic, normocephalic. Oropharynx and nasopharynx clear.  NECK:  Supple, no jugular venous distention. No thyroid enlargement, no tenderness.  LUNGS: Normal breath sounds bilaterally, no wheezing, rales,rhonchi or  crepitation. No use of accessory muscles of respiration.  CARDIOVASCULAR: S1, S2 normal. No murmurs, rubs, or gallops.  ABDOMEN: Soft, nontender, nondistended. Bowel sounds present. No organomegaly or mass.  EXTREMITIES: No pedal edema, cyanosis, or clubbing.  NEUROLOGIC: Cranial nerves II through XII are intact. Muscle strength 5/5 in all extremities. Sensation intact. Gait not checked.  PSYCHIATRIC: The patient is alert and oriented x 3.  SKIN: No obvious rash, lesion, or ulcer.    LABORATORY PANEL:   CBC  Recent Labs Lab 11/30/15 2301  WBC 7.2  HGB 12.0*  HCT 36.6*  PLT 190   ------------------------------------------------------------------------------------------------------------------  Chemistries   Recent Labs Lab 12/01/15 1821 12/02/15 0419  NA  --  137  K  --  3.7  CL  --  98*  CO2  --  34*  GLUCOSE  --  217*  BUN  --  27*  CREATININE  --  1.57*  CALCIUM  --  8.2*  AST 15  --   ALT 10*  --   ALKPHOS 77  --   BILITOT 0.6  --    ------------------------------------------------------------------------------------------------------------------  Cardiac Enzymes No results for input(s): TROPONINI in the last 168 hours. ------------------------------------------------------------------------------------------------------------------  RADIOLOGY:  Ct Abdomen Pelvis Wo Contrast  12/01/2015  CLINICAL DATA:  Generalized abdominal discomfort. Weakness, nausea and vomiting for 1 week. EXAM: CT ABDOMEN AND PELVIS WITHOUT CONTRAST TECHNIQUE: Multidetector CT imaging of the abdomen and pelvis was performed following the standard protocol without IV contrast. COMPARISON:  None. FINDINGS: Lower chest: Mild dependent atelectasis in both lung bases. Coronary artery calcifications are seen. Liver: There is a 1.8 cm subcapsular hypodensity adjacent to the falciform ligament. Small hypodensity in the  inferior right lower lobe. Slight nodular hepatic contours. Hepatobiliary:  Gallbladder filled with stones. No gross biliary dilatation. Pancreas: Rounded masslike density in the region of the pancreatic head/ neck. This is suboptimally defined without contrast however measures at least 3.9 cm. This abuts the fourth portion of the duodenum and likely mesenteric vessels. There is an additional ill-defined soft tissue density in the region of the porta hepatis. There is no pancreatic ductal dilatation, pancreatic duct measures 2 mm in the body. Spleen: Normal. Adrenal glands: Left adrenal nodularity measuring 1.3 cm. Kidneys: Calcifications at the renal hila, likely vascular. No hydronephrosis. No perinephric stranding. Stomach/Bowel: Small hiatal hernia with distal esophageal thickening and thickening of the gastroesophageal junction. No bowel obstruction. There are no dilated or thickened small bowel loops. Motor volume of stool throughout the colon without colonic wall thickening. Mild distal diverticulosis without diverticulitis The appendix is normal. Vascular/Lymphatic: No retroperitoneal adenopathy. Abdominal aorta is normal in caliber. Dense atherosclerosis without aneurysm. Reproductive: Heterogeneous enlargement of prostate gland causing mass effect on the bladder base. Prostate gland measures 5.8 cm cranial caudal dimension. Bladder: Physiologically distended, bladder diverticulum noted at the dome. Other: No free air, free fluid, or intra-abdominal fluid collection. Minimal soft tissue density in the right inguinal canal. Musculoskeletal: Near complete disc space loss at L3-L4 with adjacent endplate irregularity, endplate sclerosis and possible erosions. There is sclerosis in ill-defined lucencies throughout the L3 vertebral body, with similar appearance extending into the posterior elements bilaterally. IMPRESSION: 1. Soft tissue density in the region of the pancreatic head, incompletely characterized without contrast, however concerning for malignancy. No pancreatic ductal  dilatation. Additional soft tissue density in the region the porta hepatis may reflect adenopathy. 2. Small ill-defined liver lesion, in this setting concerning for metastatic disease. Additional lesion adjacent the falciform ligament may reflect fatty infiltration versus additional lesion. Findings incompletely characterized without contrast per 3. Abnormal appearance of L3-L4 in the lumbar spine is nonspecific. Paget's disease of L3 is considered, however given the adjacent irregularity of superior endplate of L4, sequela of remote infection could have a similar appearance. No paravertebral stranding or soft tissue abnormality to suggest acute infection at this time. Metastatic disease not excluded. 4. Chronic findings include cholelithiasis, enlarged prostate gland causing mass effect on the bladder base, diverticulosis, and dense atherosclerosis. 5. Nonspecific nodularity of the left adrenal gland. Please note no comparison exams are available, and lack of intravenous contrast limits evaluation of the solid viscera. Electronically Signed   By: Jeb Levering M.D.   On: 12/01/2015 02:08   Dg Chest Portable 1 View  12/01/2015  CLINICAL DATA:  Dizziness, coughing and vomiting for 1 week EXAM: PORTABLE CHEST 1 VIEW COMPARISON:  02/25/2013 FINDINGS: Normal mediastinum and cardiac silhouette. Normal pulmonary vasculature. No evidence of effusion, infiltrate, or pneumothorax. No acute bony abnormality. IMPRESSION: No acute cardiopulmonary process. Electronically Signed   By: Suzy Bouchard M.D.   On: 12/01/2015 00:07    EKG:   Orders placed or performed during the hospital encounter of 11/30/15  . ED EKG  . ED EKG  . EKG 12-Lead  . EKG 12-Lead    ASSESSMENT AND PLAN:   #1 abdominal pain nausea vomiting: Likely secondary to dehydration symptoms improved.  #2 abdominal pain secondary to  pancreatic mass with liver metastases. Seen by GI, ERCP is not done because LF that her kidney function closely.Ts  are normal. Can have outpatient  EUS. #3 acute kidney injury secondary to ATN on also nausea and vomiting: Continue IV  hydration,improved,appreciate oncology follow up. #4 diabetes mellitus type 2; hold metformin secondary to renal failure, continue sliding scale coverage, will consider starting  Glyburide from  tomorrow. 5, UTI: Started Rocephin, follow urine cultures. D/W DAUGHTERS  All the records are reviewed and case discussed with Care Management/Social Workerr. Management plans discussed with the patient, family and they are in agreement.  CODE STATUS: Full  TOTAL TIME TAKING CARE OF THIS PATIENT: 57minutes.   POSSIBLE D/C IN 1-2 DAYS, DEPENDING ON CLINICAL CONDITION.   Epifanio Lesches M.D on 12/02/2015 at 10:41 AM  Between 7am to 6pm - Pager - [phone]  After 6pm go to [email or web address] - password [email or web address] [facility]  [facility]  Office  [phone]  CC: Primary care physician; No primary care provider on file.   Note: This dictation was prepared with Dragon dictation along with smaller phrase technology. Any transcriptional errors that result from this process are unintentional.

## 2015-12-02 NOTE — Consult Note (Signed)
GI Inpatient Consult Note  Reason for Consult:   Attending Requesting Consult:  History of Present Illness: Gerald Molina is a 81 y.o. male with anorexia, dyspepsia, weight loss, and weakness. No food in several days. Feels better today. CT suggests pancreatic head mass. Tumor markers sent. LFT normal.  At least 50 pack year smoking hx. No alcohol. No family hx of pancreatic cancer.  Past Medical History:  Past Medical History  Diagnosis Date  . DM type 2 (diabetes mellitus, type 2) ([place])   . Gastric ulcer   . Hepatitis C   . Arthritis     Problem List: Patient Active Problem List   Diagnosis Date Noted  . AKI (acute kidney injury) ([place]) 12/01/2015  . Protein-calorie malnutrition, severe 12/01/2015  . Pancreatic mass     Past Surgical History: Past Surgical History  Procedure Laterality Date  . None      Allergies: No Known Allergies  Home Medications: Prescriptions prior to admission  Medication Sig Dispense Refill Last Dose  . aspirin EC 81 MG tablet Take 81 mg by mouth daily.   11/30/2015 at Unknown time  . ferrous sulfate 325 (65 FE) MG tablet Take 325 mg by mouth daily with breakfast.   11/30/2015 at Unknown time  . glyBURIDE (DIABETA) 5 MG tablet Take 5 mg by mouth daily with breakfast.   11/30/2015 at Unknown time  . lisinopril (PRINIVIL,ZESTRIL) 5 MG tablet Take 5 mg by mouth daily.   11/30/2015 at Unknown time  . metFORMIN (GLUCOPHAGE) 500 MG tablet Take 500 mg by mouth 2 (two) times daily with a meal.   11/30/2015 at Unknown time  . pantoprazole (PROTONIX) 40 MG tablet Take 40 mg by mouth daily.   11/29/2015 at unknown   Home medication reconciliation was completed with the patient.   Scheduled Inpatient Medications:   . aspirin EC  81 mg Oral Daily  . cefTRIAXone (ROCEPHIN)  IV  1 g Intravenous Q24H  . docusate sodium  100 mg Oral BID  . feeding supplement (ENSURE ENLIVE)  237 mL Oral TID  . ferrous sulfate  325 mg Oral Q breakfast  . heparin  5,000 Units  Subcutaneous 3 times per day  . insulin aspart  0-9 Units Subcutaneous TID WC  . insulin aspart  3 Units Subcutaneous TID WC  . nicotine  21 mg Transdermal Daily  . pantoprazole  40 mg Oral QAC breakfast    Continuous Inpatient Infusions:   . sodium chloride 50 mL/hr at 12/02/15 0810    PRN Inpatient Medications:  LORazepam, morphine injection  Family History: family history is not on file.  The patient's family history is negative for inflammatory bowel disorders, GI malignancy, or solid organ transplantation.  Social History:   reports that he has been smoking Cigarettes.  He has been smoking about 1.00 pack per day. He does not have any smokeless tobacco history on file. He reports that he uses illicit drugs (Marijuana) about twice per week. He reports that he does not drink alcohol. The patient denies ETOH, tobacco, or drug use.   Review of Systems: Constitutional: Weight is stable.  Eyes: No changes in vision. ENT: No oral lesions, sore throat.  GI: see HPI.  Heme/Lymph: No easy bruising.  CV: No chest pain.  GU: No hematuria.  Integumentary: No rashes.  Neuro: No headaches.  Psych: No depression/anxiety.  Endocrine: No heat/cold intolerance.  Allergic/Immunologic: No urticaria.  Resp: No cough, SOB.  Musculoskeletal: No joint swelling.  Physical Examination: BP 105/62 mmHg  Pulse 68  Temp(Src) 98.5 F (36.9 C) (Oral)  Resp 18  Ht 6' (1.829 m)  Wt 54.522 kg (120 lb 3.2 oz)  BMI 16.30 kg/m2  SpO2 100% Gen: NAD, alert and oriented x 4 but thin. HEENT: PEERLA, EOMI, Neck: supple, no JVD or thyromegaly Chest: CTA bilaterally, no wheezes, crackles, or other adventitious sounds CV: RRR, no m/g/c/r Abd: soft, NT, ND, +BS in all four quadrants; no HSM, guarding, ridigity, or rebound tenderness Ext: no edema, well perfused with 2+ pulses, Skin: no rash or lesions noted Lymph: no LAD  Data: Lab Results  Component Value Date   WBC 7.2 11/30/2015   HGB 12.0*  11/30/2015   HCT 36.6* 11/30/2015   MCV 84.1 11/30/2015   PLT 190 11/30/2015    Recent Labs Lab 11/30/15 2301  HGB 12.0*   Lab Results  Component Value Date   NA 137 12/02/2015   K 3.7 12/02/2015   CL 98* 12/02/2015   CO2 34* 12/02/2015   BUN 27* 12/02/2015   CREATININE 1.57* 12/02/2015   Lab Results  Component Value Date   ALT 10* 12/01/2015   AST 15 12/01/2015   ALKPHOS 77 12/01/2015   BILITOT 0.6 12/01/2015    Recent Labs Lab 12/01/15 1821  INR 1.06   Assessment/Plan: Gerald Molina is a 81 y.o. male  With possible pancreatic mass.  Recommendations: With LFT being normal, no indication for ERCP. However, would recommend EUS for aspiration. Will check lipase level. Await tumor markers. EUS can be arranged with [facility]. Can be discharged once clinically stable.  Thank you for the consult. Please call with questions or concerns.  Jasa Dundon, Lupita Dawn, MD

## 2015-12-02 NOTE — Care Management (Signed)
Patient admitted with abdominal pain, nausea and vomiting. The abdominal pain is due to pancreatic mass with metastasis to the liver.  He is to have and Endoscopic ultra sound at [facility] per oncology nurse navigator.  Patient's pcp is Dr Koleen Nimrod at the [facility] in the [facility].  Patient does not know if he was offer transfer to [place] from the [facility] and says "I am not in any mood to discuss anything.  Everybody asking me the same questions."  Daughter Mancel Bale is at bedside [phone].  She is interested in contact information for meals on wheels.  Discussed the [facility] may be able to provide some in home resources but patient is adamant that he does not need anything.  [facility].   Faxed H/P to [place].

## 2015-12-03 DIAGNOSIS — N390 Urinary tract infection, site not specified: Secondary | ICD-10-CM

## 2015-12-03 HISTORY — DX: Urinary tract infection, site not specified: N39.0

## 2015-12-03 LAB — GLUCOSE, CAPILLARY
Glucose-Capillary: 303 mg/dL — ABNORMAL HIGH (ref 65–99)
Glucose-Capillary: 43 mg/dL — CL (ref 65–99)
Glucose-Capillary: 137 mg/dL — ABNORMAL HIGH (ref 65–99)

## 2015-12-03 LAB — CEA: CEA: 5.6 ng/mL — ABNORMAL HIGH (ref 0.0–4.7)

## 2015-12-03 LAB — CANCER ANTIGEN 19-9: CA 19 9: 30 U/mL (ref 0–35)

## 2015-12-03 MED ORDER — HYDRALAZINE HCL 25 MG PO TABS: 25.0000 mg | ORAL_TABLET | Freq: Three times a day (TID) | ORAL | Status: DC

## 2015-12-03 MED ORDER — ENSURE ENLIVE PO LIQD: 237.0000 mL | Freq: Three times a day (TID) | ORAL | Status: AC

## 2015-12-03 MED ORDER — ACETAMINOPHEN 325 MG PO TABS
650.0000 mg | ORAL_TABLET | Freq: Four times a day (QID) | ORAL | Status: DC | PRN
  Filled 2015-12-03: qty 2

## 2015-12-03 MED ORDER — DEXTROSE 50 % IV SOLN
1.0000 | Freq: Once | INTRAVENOUS | Status: AC
  Administered 2015-12-03: 50 mL via INTRAVENOUS

## 2015-12-03 MED ORDER — CIPROFLOXACIN HCL 500 MG PO TABS: 500.0000 mg | ORAL_TABLET | Freq: Every day | ORAL | Status: DC

## 2015-12-03 MED ORDER — DEXTROSE 50 % IV SOLN
INTRAVENOUS | Status: AC
Start: 2015-12-03 — End: 2015-12-03
  Administered 2015-12-03: 12:00:00
  Filled 2015-12-03: qty 50

## 2015-12-03 MED ORDER — CIPROFLOXACIN HCL 500 MG PO TABS: 500.0000 mg | ORAL_TABLET | Freq: Two times a day (BID) | ORAL | Status: DC

## 2015-12-03 NOTE — Progress Notes (Signed)
[place] at [place] NAME: Gerald Molina    MR#:  [id number]  DATE OF BIRTH:  September 26, 1935  SUBJECTIVE: 81 year old male patient admitted because of nausea, vomiting, abdominal pain. Recently seen today. Denies any nausea, vomiting or abdominal pain today. CT of the abdomen concerning for pancreatic mass. In by oncology, GI. Because LFTs are normal. GI is not planning to do any ERCP.  Will discharge pt home today.and schedule EUS with [facility],  CHIEF COMPLAINT:   Chief Complaint  Patient presents with  . Weakness    REVIEW OF SYSTEMS:   ROS CONSTITUTIONAL: No fever, fatigue or weakness.  EYES: No blurred or double vision.  EARS, NOSE, AND THROAT: No tinnitus or ear pain.  RESPIRATORY: No cough, shortness of breath, wheezing or hemoptysis.  CARDIOVASCULAR: No chest pain, orthopnea, edema.  GASTROINTESTINAL: No nausea, vomiting, diarrhea or abdominal pain.  GENITOURINARY: No dysuria, hematuria.  ENDOCRINE: No polyuria, nocturia,  HEMATOLOGY: No anemia, easy bruising or bleeding SKIN: No rash or lesion. MUSCULOSKELETAL: No joint pain or arthritis.   NEUROLOGIC: No tingling, numbness, weakness.  PSYCHIATRY: No anxiety or depression.   DRUG ALLERGIES:  No Known Allergies  VITALS:  Blood pressure 163/71, pulse 76, temperature 98.5 F (36.9 C), temperature source Oral, resp. rate 18, height 6' (1.829 m), weight 55.974 kg (123 lb 6.4 oz), SpO2 100 %.  PHYSICAL EXAMINATION:  GENERAL:  81 y.o.-year-old patient lying in the bed with no acute distress.  EYES: Pupils equal, round, reactive to light and accommodation. No scleral icterus. Extraocular muscles intact.  HEENT: Head atraumatic, normocephalic. Oropharynx and nasopharynx clear.  NECK:  Supple, no jugular venous distention. No thyroid enlargement, no tenderness.  LUNGS: Normal breath sounds bilaterally, no wheezing, rales,rhonchi or crepitation. No use of accessory muscles of  respiration.  CARDIOVASCULAR: S1, S2 normal. No murmurs, rubs, or gallops.  ABDOMEN: Soft, nontender, nondistended. Bowel sounds present. No organomegaly or mass.  EXTREMITIES: No pedal edema, cyanosis, or clubbing.  NEUROLOGIC: Cranial nerves II through XII are intact. Muscle strength 5/5 in all extremities. Sensation intact. Gait not checked.  PSYCHIATRIC: The patient is alert and oriented x 3.  SKIN: No obvious rash, lesion, or ulcer.    LABORATORY PANEL:   CBC  Recent Labs Lab 11/30/15 2301  WBC 7.2  HGB 12.0*  HCT 36.6*  PLT 190   ------------------------------------------------------------------------------------------------------------------  Chemistries   Recent Labs Lab 12/01/15 1821 12/02/15 0419  NA  --  137  K  --  3.7  CL  --  98*  CO2  --  34*  GLUCOSE  --  217*  BUN  --  27*  CREATININE  --  1.57*  CALCIUM  --  8.2*  AST 15  --   ALT 10*  --   ALKPHOS 77  --   BILITOT 0.6  --    ------------------------------------------------------------------------------------------------------------------  Cardiac Enzymes No results for input(s): TROPONINI in the last 168 hours. ------------------------------------------------------------------------------------------------------------------  RADIOLOGY:  No results found.  EKG:   Orders placed or performed during the hospital encounter of 11/30/15  . ED EKG  . ED EKG  . EKG 12-Lead  . EKG 12-Lead    ASSESSMENT AND PLAN:   #1 abdominal pain nausea vomiting: Likely secondary to dehydration symptoms improved.  #2 abdominal pain secondary to  pancreatic mass with liver metastases. Seen by GI, ERCP is not done because LF that her kidney function closely.Ts are normal. Can have outpatient  EUS with [facility].  #  3 acute kidney injury secondary to ATN on also nausea and vomiting:   hydration,improved. #4 diabetes mellitus type 2; hold metformin secondary to renal failure, continue sliding scale coverage, because  of poor po intake glyburide was not started. 5, UTI: Started Rocephin, discharged home with cipro D/W DAUGHTERS ,stable for discharge today. All the records are reviewed and case discussed with Care Management/Social Workerr. Management plans discussed with the patient, family and they are in agreement.  CODE STATUS: Full  TOTAL TIME TAKING CARE OF THIS PATIENT: 5minutes.   POSSIBLE D/C IN 1-2 DAYS, DEPENDING ON CLINICAL CONDITION.   Epifanio Lesches M.D on 12/03/2015 at 9:07 PM  Between 7am to 6pm - Pager - [phone]  After 6pm go to [email or web address] - password [email or web address] [facility]  [place] [facility]  Office  [phone]  CC: Primary care physician; No primary care provider on file.   Note: This dictation was prepared with Dragon dictation along with smaller phrase technology. Any transcriptional errors that result from this process are unintentional.

## 2015-12-03 NOTE — Care Management Important Message (Signed)
Important Message  Patient Details  Name: Gerald Molina MRN: [id number] Date of Birth: 1935/04/30   Medicare Important Message Given:  Yes    Juliann Pulse A Derrick Orris 12/03/2015, 11:39 AM

## 2015-12-03 NOTE — Progress Notes (Signed)
Pt is a&o, VSS, 1amp of D50 given for low cbg with positive affect. Orders to d/c pt to home. Discharge instructions given to pt and granddaughter. Paper prescriptions given to pt and faxed to [facility]. IV and tele removed and pt escorted off unit via wheelchair by nursing.

## 2015-12-03 NOTE — Progress Notes (Signed)
Inpatient Diabetes Program Recommendations  AACE/ADA: New Consensus Statement on Inpatient Glycemic Control (2015)  Target Ranges:  Prepandial:   less than 140 mg/dL      Peak postprandial:   less than 180 mg/dL (1-2 hours)      Critically ill patients:  140 - 180 mg/dL   Review of Glycemic Control  Results for Gerald Molina, Gerald Molina (MRN [id number]) as of 12/03/2015 08:18  Ref. Range 12/02/2015 11:31 12/02/2015 16:35 12/02/2015 21:09 12/02/2015 21:10 12/03/2015 07:44  Glucose-Capillary Latest Ref Range: 65-99 mg/dL 120 (H) 179 (H) 54 (L) 97 303 (H)    Diabetes history: Type 2 Outpatient Diabetes medications: Glyburide 5mg /day, Metformin 500mg  bid Current orders for Inpatient glycemic control: Novolog 0-9 units tid, Novolog 3 units with meals (if he eats more than 50%)  Inpatient Diabetes Program Recommendations: Agree with current orders for diabetes medication management.  Gentry Fitz, RN, BA, MHA, CDE Diabetes Coordinator Inpatient Diabetes Program  [phone] (Team Pager) [phone] ([place]) 12/03/2015 8:32 AM

## 2015-12-03 NOTE — Care Management (Signed)
Oncology nurse navigator was contacted in regards to scheduling of EUS at [facility].  Informed that referral was "sent off this morning."  It is projected procedure will be scheduled next week.  Patient's daughter Gerald Molina to be notified of appointment/instructions.  If for some reason  unable to contact Gerald Molina; other daughter Gerald Molina will be notified.

## 2015-12-04 NOTE — Discharge Summary (Signed)
Gerald Molina, is a 81 y.o. male  DOB 1935-04-27  MRN [id number].  Admission date:  11/30/2015  Admitting Physician  Harrie Foreman, MD  Discharge Date:  12/03/2015   Primary MD  No primary care provider on file.  Recommendations for primary care physician for things to follow:   Follow up with [facility] for endoscopic ultrasound.   Admission Diagnosis  Generalized abdominal discomfort [R10.84]   Discharge Diagnosis  Generalized abdominal discomfort [R10.84]   Active Problems:   AKI (acute kidney injury) ([place])   Protein-calorie malnutrition, severe   Pancreatic mass      Past Medical History  Diagnosis Date  . DM type 2 (diabetes mellitus, type 2) ([place])   . Gastric ulcer   . Hepatitis C   . Arthritis     Past Surgical History  Procedure Laterality Date  . None         History of present illness and  Hospital Course:     Kindly see H&P for history of present illness and admission details, please review complete Labs, Consult reports and Test reports for all details in brief  HPI  from the history and physical done on the day of admission 81 year old male patient admitted because of nausea, vomiting, abdominal pain .found to have pancreatic mass. Patient found to have acute kidney injury and admitted for the same.   Hospital Course  1 acute kidney injury secondary to dehydration: Patient received IV hydration. We had stopped his metformin, lisinopril. The function improved. At and now 2.6 on admission dropped to 1.57.  2,Abdominal pain: CT of the abdomen showed soft tissue density in the pancreatic head concerning for malignancy. Also small lesion in the liver concerning for metastasis. Same discussed with the patient's daughters. Oncology consult obtained, for EUS.Marland Kitchen LFTs normal. Chest did not have any was  here because of abnormal LFTs. Oncology nurse ,trying to make EUS at [facility]..[facility] will call daughters once they schedule appointments. Patient had anorexia, dyspepsia, weight loss, weakness. Tumor markers showed  CEA levels is 5.6, CA-19-9 level 30. 3, UTI: Started on Rocephin here, discharge home with Cipro.  4 diabetes mellitus type 2: Patient started on insulin with sliding scale coverage only. Metformin, glyburide were stopped because of poor by mouth intake and also renal insufficiency. Metformin, not continued at discharge because unpredictable. Intake and renal insufficiency. Patient can follow with primary doctor before restarting them.continued on glyburide at Discharge. Discharge Condition: stable,   Follow UP  Follow-up Information    Follow up with [facility].   Contact information:   [phone]        Discharge Instructions  and  Discharge Medications      Medication List    STOP taking these medications        lisinopril 5 MG tablet  Commonly known as:  PRINIVIL,ZESTRIL     metFORMIN 500 MG tablet  Commonly known as:  GLUCOPHAGE      TAKE these medications        aspirin EC 81 MG tablet  Take 81 mg by mouth daily.     ciprofloxacin 500 MG tablet  Commonly known as:  CIPRO  Take 1 tablet (500 mg total) by mouth daily.     feeding supplement (ENSURE ENLIVE) Liqd  Take 237 mLs by mouth 3 (three) times daily.     ferrous sulfate 325 (65 FE) MG tablet  Take 325 mg by mouth daily with breakfast.  glyBURIDE 5 MG tablet  Commonly known as:  DIABETA  Take 5 mg by mouth daily with breakfast.     pantoprazole 40 MG tablet  Commonly known as:  PROTONIX  Take 40 mg by mouth daily.          Diet and Activity recommendation: See Discharge Instructions above   Consults obtained - GI Oncology   Major procedures and Radiology Reports - PLEASE review detailed and final reports for all details, in brief -     Ct Abdomen Pelvis  Wo Contrast  12/01/2015  CLINICAL DATA:  Generalized abdominal discomfort. Weakness, nausea and vomiting for 1 week. EXAM: CT ABDOMEN AND PELVIS WITHOUT CONTRAST TECHNIQUE: Multidetector CT imaging of the abdomen and pelvis was performed following the standard protocol without IV contrast. COMPARISON:  None. FINDINGS: Lower chest: Mild dependent atelectasis in both lung bases. Coronary artery calcifications are seen. Liver: There is a 1.8 cm subcapsular hypodensity adjacent to the falciform ligament. Small hypodensity in the inferior right lower lobe. Slight nodular hepatic contours. Hepatobiliary: Gallbladder filled with stones. No gross biliary dilatation. Pancreas: Rounded masslike density in the region of the pancreatic head/ neck. This is suboptimally defined without contrast however measures at least 3.9 cm. This abuts the fourth portion of the duodenum and likely mesenteric vessels. There is an additional ill-defined soft tissue density in the region of the porta hepatis. There is no pancreatic ductal dilatation, pancreatic duct measures 2 mm in the body. Spleen: Normal. Adrenal glands: Left adrenal nodularity measuring 1.3 cm. Kidneys: Calcifications at the renal hila, likely vascular. No hydronephrosis. No perinephric stranding. Stomach/Bowel: Small hiatal hernia with distal esophageal thickening and thickening of the gastroesophageal junction. No bowel obstruction. There are no dilated or thickened small bowel loops. Motor volume of stool throughout the colon without colonic wall thickening. Mild distal diverticulosis without diverticulitis The appendix is normal. Vascular/Lymphatic: No retroperitoneal adenopathy. Abdominal aorta is normal in caliber. Dense atherosclerosis without aneurysm. Reproductive: Heterogeneous enlargement of prostate gland causing mass effect on the bladder base. Prostate gland measures 5.8 cm cranial caudal dimension. Bladder: Physiologically distended, bladder diverticulum noted  at the dome. Other: No free air, free fluid, or intra-abdominal fluid collection. Minimal soft tissue density in the right inguinal canal. Musculoskeletal: Near complete disc space loss at L3-L4 with adjacent endplate irregularity, endplate sclerosis and possible erosions. There is sclerosis in ill-defined lucencies throughout the L3 vertebral body, with similar appearance extending into the posterior elements bilaterally. IMPRESSION: 1. Soft tissue density in the region of the pancreatic head, incompletely characterized without contrast, however concerning for malignancy. No pancreatic ductal dilatation. Additional soft tissue density in the region the porta hepatis may reflect adenopathy. 2. Small ill-defined liver lesion, in this setting concerning for metastatic disease. Additional lesion adjacent the falciform ligament may reflect fatty infiltration versus additional lesion. Findings incompletely characterized without contrast per 3. Abnormal appearance of L3-L4 in the lumbar spine is nonspecific. Paget's disease of L3 is considered, however given the adjacent irregularity of superior endplate of L4, sequela of remote infection could have a similar appearance. No paravertebral stranding or soft tissue abnormality to suggest acute infection at this time. Metastatic disease not excluded. 4. Chronic findings include cholelithiasis, enlarged prostate gland causing mass effect on the bladder base, diverticulosis, and dense atherosclerosis. 5. Nonspecific nodularity of the left adrenal gland. Please note no comparison exams are available, and lack of intravenous contrast limits evaluation of the solid viscera. Electronically Signed   By: Fonnie Birkenhead.D.  On: 12/01/2015 02:08   Dg Chest Portable 1 View  12/01/2015  CLINICAL DATA:  Dizziness, coughing and vomiting for 1 week EXAM: PORTABLE CHEST 1 VIEW COMPARISON:  02/25/2013 FINDINGS: Normal mediastinum and cardiac silhouette. Normal pulmonary vasculature. No  evidence of effusion, infiltrate, or pneumothorax. No acute bony abnormality. IMPRESSION: No acute cardiopulmonary process. Electronically Signed   By: Suzy Bouchard M.D.   On: 12/01/2015 00:07    Micro Results    No results found for this or any previous visit (from the past 240 hour(s)).     Today   Subjective:   Gerald Molina today has no headache,no chest abdominal pain,no new weakness tingling or numbness, feels much better wants to go home today.  Objective:   Blood pressure 163/71, pulse 76, temperature 98.5 F (36.9 C), temperature source Oral, resp. rate 18, height 6' (1.829 m), weight 55.974 kg (123 lb 6.4 oz), SpO2 100 %.   Intake/Output Summary (Last 24 hours) at 12/04/15 0745 Last data filed at 12/03/15 1249  Gross per 24 hour  Intake    440 ml  Output   1400 ml  Net   -960 ml    Exam Awake Alert, Oriented x 3, No new F.N deficits, Normal affect [place].AT,PERRAL Supple Neck,No JVD, No cervical lymphadenopathy appriciated.  Symmetrical Chest wall movement, Good air movement bilaterally, CTAB RRR,No Gallops,Rubs or new Murmurs, No Parasternal Heave +ve B.Sounds, Abd Soft, Non tender, No organomegaly appriciated, No rebound -guarding or rigidity. No Cyanosis, Clubbing or edema, No new Rash or bruise  Data Review   CBC w Diff: Lab Results  Component Value Date   WBC 7.2 11/30/2015   WBC 4.8 02/27/2013   HGB 12.0* 11/30/2015   HGB 9.3* 02/27/2013   HCT 36.6* 11/30/2015   HCT 27.7* 02/27/2013   PLT 190 11/30/2015   PLT 119* 02/27/2013   LYMPHOPCT 34.5 02/27/2013   MONOPCT 9.8 02/27/2013   EOSPCT 3.1 02/27/2013   BASOPCT 0.6 02/27/2013    CMP: Lab Results  Component Value Date   NA 137 12/02/2015   NA 145 02/27/2013   K 3.7 12/02/2015   K 4.4 02/27/2013   CL 98* 12/02/2015   CL 113* 02/27/2013   CO2 34* 12/02/2015   CO2 26 02/27/2013   BUN 27* 12/02/2015   BUN 18 02/27/2013   CREATININE 1.57* 12/02/2015   CREATININE 0.96 02/27/2013   PROT  6.4* 12/01/2015   PROT 6.3* 02/27/2013   ALBUMIN 2.8* 12/01/2015   ALBUMIN 2.5* 02/27/2013   BILITOT 0.6 12/01/2015   BILITOT 0.3 02/27/2013   ALKPHOS 77 12/01/2015   ALKPHOS 83 02/27/2013   AST 15 12/01/2015   AST 19 02/27/2013   ALT 10* 12/01/2015   ALT 21 02/27/2013  .   Total Time in preparing paper work, data evaluation and todays exam - 2 minutes  Gerald Molina M.D on 12/03/2015 at 7:45 AM    Note: This dictation was prepared with Dragon dictation along with smaller phrase technology. Any transcriptional errors that result from this process are unintentional.

## 2015-12-07 ENCOUNTER — Other Ambulatory Visit: Payer: Self-pay | Admitting: Family Medicine

## 2015-12-07 NOTE — Progress Notes (Signed)
  Oncology Nurse Navigator Documentation  Navigator Location: [facility] (12/07/15 1600)                         Coordination of Care: EUS (12/07/15 1600)                  Time Spent with Patient: 15 (12/07/15 1600)   Notified that EUS has been scheduled for 12/08/15 at [facility] with Dr Francella Solian

## 2015-12-10 ENCOUNTER — Other Ambulatory Visit: Payer: Self-pay | Admitting: Family Medicine

## 2015-12-10 ENCOUNTER — Encounter: Payer: Self-pay | Admitting: Family Medicine

## 2015-12-10 DIAGNOSIS — C259 Malignant neoplasm of pancreas, unspecified: Secondary | ICD-10-CM

## 2015-12-10 DIAGNOSIS — C250 Malignant neoplasm of head of pancreas: Secondary | ICD-10-CM

## 2015-12-10 HISTORY — DX: Malignant neoplasm of pancreas, unspecified: C25.9

## 2015-12-13 ENCOUNTER — Encounter: Payer: Self-pay | Admitting: Internal Medicine

## 2015-12-13 ENCOUNTER — Inpatient Hospital Stay: Payer: Medicare Other | Attending: Internal Medicine | Admitting: Internal Medicine

## 2015-12-13 ENCOUNTER — Inpatient Hospital Stay: Payer: Medicare Other

## 2015-12-13 VITALS — BP 171/94 | HR 80 | Temp 97.5°F | Ht 72.0 in | Wt 125.0 lb

## 2015-12-13 DIAGNOSIS — Z79899 Other long term (current) drug therapy: Secondary | ICD-10-CM | POA: Insufficient documentation

## 2015-12-13 DIAGNOSIS — Z7982 Long term (current) use of aspirin: Secondary | ICD-10-CM | POA: Insufficient documentation

## 2015-12-13 DIAGNOSIS — G8929 Other chronic pain: Secondary | ICD-10-CM | POA: Insufficient documentation

## 2015-12-13 DIAGNOSIS — N400 Enlarged prostate without lower urinary tract symptoms: Secondary | ICD-10-CM | POA: Diagnosis not present

## 2015-12-13 DIAGNOSIS — N323 Diverticulum of bladder: Secondary | ICD-10-CM | POA: Insufficient documentation

## 2015-12-13 DIAGNOSIS — R531 Weakness: Secondary | ICD-10-CM | POA: Insufficient documentation

## 2015-12-13 DIAGNOSIS — F17210 Nicotine dependence, cigarettes, uncomplicated: Secondary | ICD-10-CM | POA: Diagnosis not present

## 2015-12-13 DIAGNOSIS — R109 Unspecified abdominal pain: Secondary | ICD-10-CM | POA: Insufficient documentation

## 2015-12-13 DIAGNOSIS — M1990 Unspecified osteoarthritis, unspecified site: Secondary | ICD-10-CM | POA: Insufficient documentation

## 2015-12-13 DIAGNOSIS — C250 Malignant neoplasm of head of pancreas: Secondary | ICD-10-CM | POA: Insufficient documentation

## 2015-12-13 DIAGNOSIS — R5383 Other fatigue: Secondary | ICD-10-CM | POA: Insufficient documentation

## 2015-12-13 DIAGNOSIS — Z7984 Long term (current) use of oral hypoglycemic drugs: Secondary | ICD-10-CM | POA: Diagnosis not present

## 2015-12-13 DIAGNOSIS — Z87828 Personal history of other (healed) physical injury and trauma: Secondary | ICD-10-CM | POA: Diagnosis not present

## 2015-12-13 DIAGNOSIS — R112 Nausea with vomiting, unspecified: Secondary | ICD-10-CM

## 2015-12-13 DIAGNOSIS — C787 Secondary malignant neoplasm of liver and intrahepatic bile duct: Secondary | ICD-10-CM | POA: Diagnosis not present

## 2015-12-13 DIAGNOSIS — Z8619 Personal history of other infectious and parasitic diseases: Secondary | ICD-10-CM | POA: Insufficient documentation

## 2015-12-13 DIAGNOSIS — R630 Anorexia: Secondary | ICD-10-CM | POA: Insufficient documentation

## 2015-12-13 DIAGNOSIS — E119 Type 2 diabetes mellitus without complications: Secondary | ICD-10-CM | POA: Insufficient documentation

## 2015-12-13 DIAGNOSIS — R1013 Epigastric pain: Secondary | ICD-10-CM | POA: Diagnosis not present

## 2015-12-13 DIAGNOSIS — I2510 Atherosclerotic heart disease of native coronary artery without angina pectoris: Secondary | ICD-10-CM | POA: Diagnosis not present

## 2015-12-13 DIAGNOSIS — K7689 Other specified diseases of liver: Secondary | ICD-10-CM | POA: Diagnosis not present

## 2015-12-13 DIAGNOSIS — R634 Abnormal weight loss: Secondary | ICD-10-CM | POA: Diagnosis not present

## 2015-12-13 DIAGNOSIS — K8020 Calculus of gallbladder without cholecystitis without obstruction: Secondary | ICD-10-CM | POA: Insufficient documentation

## 2015-12-13 LAB — CBC WITH DIFFERENTIAL/PLATELET
BASOS ABS: 0.1 10*3/uL (ref 0–0.1)
Basophils Relative: 1 %
Eosinophils Absolute: 0.1 10*3/uL (ref 0–0.7)
Eosinophils Relative: 1 %
HEMATOCRIT: 30.8 % — AB (ref 40.0–52.0)
HEMOGLOBIN: 10.1 g/dL — AB (ref 13.0–18.0)
LYMPHS PCT: 22 %
Lymphs Abs: 1.4 10*3/uL (ref 1.0–3.6)
MCH: 27.5 pg (ref 26.0–34.0)
MCHC: 32.6 g/dL (ref 32.0–36.0)
MCV: 84.2 fL (ref 80.0–100.0)
MONO ABS: 0.5 10*3/uL (ref 0.2–1.0)
Monocytes Relative: 8 %
NEUTROS ABS: 4.6 10*3/uL (ref 1.4–6.5)
NEUTROS PCT: 68 %
Platelets: 214 10*3/uL (ref 150–440)
RBC: 3.66 MIL/uL — AB (ref 4.40–5.90)
RDW: 15.9 % — AB (ref 11.5–14.5)
WBC: 6.7 10*3/uL (ref 3.8–10.6)

## 2015-12-13 LAB — COMPREHENSIVE METABOLIC PANEL
ALBUMIN: 3.3 g/dL — AB (ref 3.5–5.0)
ALK PHOS: 90 U/L (ref 38–126)
ALT: 16 U/L — AB (ref 17–63)
AST: 23 U/L (ref 15–41)
Anion gap: 6 (ref 5–15)
BILIRUBIN TOTAL: 0.7 mg/dL (ref 0.3–1.2)
BUN: 18 mg/dL (ref 6–20)
CALCIUM: 8.7 mg/dL — AB (ref 8.9–10.3)
CO2: 28 mmol/L (ref 22–32)
CREATININE: 0.94 mg/dL (ref 0.61–1.24)
Chloride: 96 mmol/L — ABNORMAL LOW (ref 101–111)
GFR calc Af Amer: >60 mL/min (ref >60)
GLUCOSE: 188 mg/dL — AB (ref 65–99)
Potassium: 4.2 mmol/L (ref 3.5–5.1)
Sodium: 130 mmol/L — ABNORMAL LOW (ref 135–145)
TOTAL PROTEIN: 7.7 g/dL (ref 6.5–8.1)

## 2015-12-13 NOTE — Patient Instructions (Signed)
PET Scan A PET scan, also called positron emission tomography, is a test that creates pictures of the inside of the body. A PET scan requires a small dose of a harmless radioactive material to be injected into a vein. When this material combines with certain substances in the body, it produces tiny particles that can be detected by a scanner and converted into pictures.  The pictures created during a PET scan can be used to study a disease. They are often used to study cancer and cancer therapy. The colors and brightness on the pictures show different levels of organ and tissue function. For example, cancer tissue appears brighter than normal tissue on a PET scan picture. LET [facility] CARE PROVIDER KNOW ABOUT:   Any allergies you have.  All medicines you are taking, including vitamins, herbs, eye drops, creams, and over-the-counter medicines.  Previous problems you or members of your family have had with the use of anesthetics.  Any blood disorders you have.  Previous surgeries you have had.  Medical conditions you have.  If you are afraid of cramped spaces (claustrophobic). If claustrophobia is a problem, it usually can be relieved with mild sedatives or antianxiety medicines.  If you have trouble staying still for long periods of time. BEFORE THE PROCEDURE   Do not eat or drink anything after midnight on the night before the procedure or as directed by your health care provider.  Take medicines only as directed by your health care provider.  If you have diabetes, ask your health care provider for diet guidelines to control sugar (glucose) levels on the day of the test. PROCEDURE   A small amount of radioactive material will be injected into a vein. The test will begin 30-60 minutes after the injection, when the material has traveled around your body.  You will lie on a cushioned table, and the table will be moved through the center of a machine that looks like a large donut. It  will take about 30-60 minutes for the machine to produce pictures of your body. You will need to stay still during this time. AFTER THE PROCEDURE  You may resume your normal diet and activities.  Drink several 8 oz glasses of water following the test to flush the radioactive material out of your body.   This information is not intended to replace advice given to you by your health care provider. Make sure you discuss any questions you have with your health care provider.   Document Released: 05/20/2003 Document Revised: 12/04/2014 Document Reviewed: 02/25/2014 Elsevier Interactive Patient Education 2016 [employer].      Pancreatic Cancer  Pancreatic cancer is an abnormal growth of tissue (tumor) in the pancreas that is cancerous (malignant). Unlike noncancerous (benign) tumors, malignant tumors can spread to other parts of your body. The pancreas is a gland located deep in the abdomen, between the stomach and the spine. The pancreas makes insulin and other hormones. These hormones help the body use or store the energy that comes from food. The pancreas also makes pancreatic juices. These juices contain enzymes that help digest food. Most pancreatic cancers begin in the ducts that carry pancreatic juices. When cancer of the pancreas spreads (metastasizes) outside the pancreas, cancer cells are often found in nearby lymph nodes. If the cancer has reached these nodes, it means that cancer cells may have spread to other lymph nodes or other tissues, such as the liver or lungs. Sometimes cancer of the pancreas spreads to the peritoneum. This  is the tissue that lines the abdomen. CAUSES  The exact cause of pancreatic cancer is unknown.  RISK FACTORS There are a number of risk factors that can increase your chances of getting pancreatic cancer. They include:  Age. The likelihood of developing pancreatic cancer increases with age. Most pancreatic cancers occur in people older than 60  years.  Smoking. Cigarette smokers are 2 to 3 times more likely than nonsmokers to develop pancreatic cancer.  Diabetes, especially if you were diagnosed as an adult.  Being male.  Being African American.  Family history. The risk for developing pancreatic cancer triples if a person's mother, father, sister, or brother had the disease. Also, a family history of colon cancer or ovarian cancer increases the risk of pancreatic cancer.  Chronic pancreatitis. Chronic pancreatitis is a painful condition of the pancreas.  Exposure to certain chemicals in the workplace.  Being obese or eating a diet high in fat and red meat. SYMPTOMS  Pancreatic cancer is sometimes called a "silent disease." This is because early pancreatic cancer often does not cause symptoms. As the cancer grows, symptoms may include:  Weakness.  Abdominal pain.  Diarrhea.  Depression.  Loss of appetite.  Indigestion.  Pain in the upper abdomen or upper back.  Nausea and vomiting.  Yellowing of the skin or eyes (jaundice).  Back pain.  Weight loss.  Fatigue.  Clay-colored stools.  Unexplained blood clots.  Dark urine. DIAGNOSIS  Your caregiver will ask about your medical history. He or she may also perform a number of procedures, such as:  A physical exam. Your skin and eyes will be examined for signs of jaundice. The abdomen will be checked for changes in the area near the pancreas, liver, and gallbladder. Your caregiver will also check for an abnormal buildup of fluid in the abdomen (ascites).  Lab tests. Your caregiver may take blood and urine samples to check for bilirubin and other substances. Bilirubin is a substance that passes from the liver to the intestine through the gallbladder and bile duct. If the bile duct is blocked by a tumor, the bilirubin cannot pass through normally. Blockage of the bile duct may cause the level of bilirubin in the blood and urine to become very high.  Computed  tomography (CT). An X-ray machine linked to a computer takes a series of detailed pictures of the pancreas and other organs and blood vessels in the abdomen.  Ultrasonography. The ultrasound device uses sound waves to produce pictures of the pancreas and other organs inside the abdomen.  Endoscopic retrograde cholangiopancreatography. A lighted tube (endoscope) is passed through the patient's mouth and stomach, down into the small intestine. Then a smaller tube (catheter) is inserted through the endoscope into the bile ducts and pancreatic ducts. A dye is injected through the catheter into the ducts and X-rays are taken. The X-rays can show whether the ducts are narrowed or blocked by a tumor.  Endoscopic ultrasonography. An endoscope is passed through the patient's mouth and stomach, down into the small intestine. An ultrasound device is placed down the endoscope to produce pictures of the area, including the pancreas.  Percutaneous transhepatic cholangiography. A dye is injected through a thin needle into the liver and X-rays are taken. Unless there is a blockage, the dye should move freely through the bile ducts. From the X-rays, your caregiver can tell whether there is a blockage from a tumor.  Taking a tissue sample (biopsy) from the pancreas. The sample is examined under a microscope  to look for cancer cells. Your cancer will be staged according to its severity and extent. Staging is a careful attempt to categorize your cancer to help determine which treatment will be most appropriate. Factors involved in staging include the size of the tumor, whether the cancer has spread, and if so, to what parts of the body it has spread. You may need to have more tests to determine the stage of your cancer. The test results will help determine what treatment plan is best for you. STAGES   Stage I. The cancer is only found in the pancreas.  Stage II. The cancer has spread to nearby tissues and possibly to  the lymph nodes, but not to the blood vessels.  Stage III. The cancer is surrounding the major blood vessels beside the pancreas and has possibly spread to the lymph nodes.  Stage IV. The cancer has spread to other parts of the body, such as the liver, lungs, or peritoneum. TREATMENT  Treatment generally begins within several weeks after the diagnosis. There will be time to talk with your caregiver about treatment choices, get a second opinion, and learn more about the disease. Your caregiver may refer you to a cancer specialist (oncologist).  Cancer of the pancreas is very hard to control with current treatments. For that reason, many caregivers encourage patients with this disease to consider taking part in a clinical trial. Clinical trials are an important option for people with all stages of pancreatic cancer.  At this time, pancreatic cancer can be cured only when it is found at an early stage, before it has spread. However, other treatments may be able to control the disease and help patients live longer and feel better. When a cure or control of the disease is not possible, some patients choose palliative therapy. Palliative therapy aims to improve quality of life by controlling pain and other problems caused by this disease, but it does not cure the disease. Depending on the type and stage, pancreatic cancer may be treated with surgery, radiation therapy, or chemotherapy. Some patients have a combination of these therapies.  Surgery may be done to remove all or part of the pancreas. Sometimes the cancer cannot be completely removed. However, if the tumor is blocking the common bile duct or duodenum, the surgeon can place a mesh tube (stent) in the blocked area. This helps keep the duct or duodenum open.  Radiation therapy uses high-energy rays to kill cancer cells.  Chemotherapy is the use of drugs to kill cancer cells. Caregivers also give chemotherapy to help reduce pain and other problems  caused by pancreatic cancer. HOME CARE INSTRUCTIONS   Only take over-the-counter or prescription medicines for pain, discomfort, or fever as directed by your caregiver.  Maintain a healthy diet.  Consider joining a support group. This may help you learn to cope with the stress of having pancreatic cancer.  Seek advice to help you manage treatment side effects.  Keep all follow-up appointments as directed by your caregiver. SEEK IMMEDIATE MEDICAL CARE IF:   You have a sudden increase in pain.  Your skin or eyes turn more yellow.  You lose weight without trying.  You have a fever, especially during chemotherapy treatment or after stent placement.  You notice new fatigue or weakness.   This information is not intended to replace advice given to you by your health care provider. Make sure you discuss any questions you have with your health care provider.   Document Released: 10/28/2004 Document  Revised: 12/04/2014 Document Reviewed: 12/08/2011 Elsevier Interactive Patient Education [employer].

## 2015-12-13 NOTE — Progress Notes (Signed)
All medications must be filled through the [place], [place]. Automated Refill Line - [phone].  Pt is here for newly dx pancreatic cancer (diagnosed via EUS). He is accompanied by his daughters Lupita Dawn and [place]). Daughters are very supportive and attentive to patient and are the key historians. Patient lives in a small apartment by himself. Daughter lives approximately 1 block away in the same apartment complex.  Per daughter, pt is not reliable to take his medications. He has a walker at home but refuses to use the walker. Pt has generalized weakness. He uses Glucerna as a supplement in his diet.  Pt asked if antianxiety and appetite stimulator could be prescribed. At this time, md would like to hold off prescribing these medications. Explained to patient and his daughters that patient already had reported dizziness. Explained to Xanax and Marinol medications can cause dizziness. Pt asked if md could prescribe "medical marijuana". RN explained that this drug is not approved yet in [place].
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
## 2015-12-13 NOTE — Progress Notes (Signed)
  Oncology Nurse Navigator Documentation  Navigator Location: [facility]Med Onc (12/13/15 1600) Navigator Encounter Type: Initial MedOnc (12/13/15 1600)           Patient Visit Type: MedOnc (12/13/15 1600)   Barriers/Navigation Needs: Coordination of Care (12/13/15 1600)   Interventions: Referrals;Coordination of Care (12/13/15 1600)   Coordination of Care: Appts (12/13/15 1600)                  Time Spent with Patient: 60 (12/13/15 1600)   Would like meals on wheels. Assistance with apartment finding. Will try and get scans from the [facility] and make referral to Dr Sharen Counter at [facility] for surgical eval. Encouraged small frequent high protein meals. Provided samples of glucerna and protein supplement

## 2015-12-14 ENCOUNTER — Other Ambulatory Visit: Payer: Self-pay

## 2015-12-14 DIAGNOSIS — C250 Malignant neoplasm of head of pancreas: Secondary | ICD-10-CM

## 2015-12-14 LAB — GLUCOSE, CAPILLARY: Glucose-Capillary: 187 mg/dL — ABNORMAL HIGH (ref 65–99)

## 2015-12-14 NOTE — Progress Notes (Signed)
  Oncology Nurse Navigator Documentation  Navigator Location: [facility] Onc (12/14/15 1000) Navigator Encounter Type: Telephone (12/14/15 1000)               Barriers/Navigation Needs: Coordination of Care (12/14/15 1000)   Interventions: Referrals;Coordination of Care (12/14/15 1000)   Coordination of Care: Appts (12/14/15 1000)                  Time Spent with Patient: 30 (12/14/15 1000)   Referral to Dr Sharen Counter has been made. Appt arranged for 12/27/15 at 0830. [place] notified. Also a request for medical records from the [facility] has been sent.

## 2015-12-15 ENCOUNTER — Encounter: Payer: Self-pay | Admitting: *Deleted

## 2015-12-15 NOTE — Progress Notes (Signed)
12/14/15-Received [facility] papers received for pt's daughter Virgel Gess. Will complete papers within 7-10 days.

## 2015-12-16 ENCOUNTER — Other Ambulatory Visit: Payer: Self-pay | Admitting: Family Medicine

## 2015-12-16 ENCOUNTER — Ambulatory Visit
Admission: RE | Admit: 2015-12-16 | Discharge: 2015-12-16 | Disposition: A | Discharge status: Home / Self Care | Payer: Medicare Other | Source: Ambulatory Visit | Attending: Family Medicine | Admitting: Family Medicine

## 2015-12-16 DIAGNOSIS — C787 Secondary malignant neoplasm of liver and intrahepatic bile duct: Secondary | ICD-10-CM | POA: Diagnosis not present

## 2015-12-16 DIAGNOSIS — C250 Malignant neoplasm of head of pancreas: Secondary | ICD-10-CM

## 2015-12-16 DIAGNOSIS — B1920 Unspecified viral hepatitis C without hepatic coma: Secondary | ICD-10-CM | POA: Diagnosis not present

## 2015-12-16 DIAGNOSIS — K8020 Calculus of gallbladder without cholecystitis without obstruction: Secondary | ICD-10-CM | POA: Diagnosis not present

## 2015-12-16 MED ORDER — GADOBENATE DIMEGLUMINE 529 MG/ML IV SOLN
15.0000 mL | Freq: Once | INTRAVENOUS | Status: AC | PRN
  Administered 2015-12-16: 11 mL via INTRAVENOUS

## 2015-12-17 ENCOUNTER — Inpatient Hospital Stay: Payer: Medicare Other

## 2015-12-17 ENCOUNTER — Inpatient Hospital Stay (HOSPITAL_BASED_OUTPATIENT_CLINIC_OR_DEPARTMENT_OTHER): Payer: Medicare Other | Admitting: Internal Medicine

## 2015-12-17 ENCOUNTER — Telehealth: Payer: Self-pay | Admitting: *Deleted

## 2015-12-17 ENCOUNTER — Ambulatory Visit
Admission: RE | Admit: 2015-12-17 | Discharge: 2015-12-17 | Disposition: A | Discharge status: Home / Self Care | Payer: Medicare Other | Source: Ambulatory Visit | Attending: Internal Medicine | Admitting: Internal Medicine

## 2015-12-17 VITALS — BP 161/88 | HR 72 | Temp 96.2°F | Resp 18 | Wt 127.9 lb

## 2015-12-17 DIAGNOSIS — N400 Enlarged prostate without lower urinary tract symptoms: Secondary | ICD-10-CM | POA: Insufficient documentation

## 2015-12-17 DIAGNOSIS — R630 Anorexia: Secondary | ICD-10-CM

## 2015-12-17 DIAGNOSIS — Z0189 Encounter for other specified special examinations: Secondary | ICD-10-CM | POA: Diagnosis present

## 2015-12-17 DIAGNOSIS — C787 Secondary malignant neoplasm of liver and intrahepatic bile duct: Secondary | ICD-10-CM | POA: Insufficient documentation

## 2015-12-17 DIAGNOSIS — K769 Liver disease, unspecified: Secondary | ICD-10-CM

## 2015-12-17 DIAGNOSIS — K5730 Diverticulosis of large intestine without perforation or abscess without bleeding: Secondary | ICD-10-CM | POA: Insufficient documentation

## 2015-12-17 DIAGNOSIS — K7689 Other specified diseases of liver: Secondary | ICD-10-CM | POA: Diagnosis not present

## 2015-12-17 DIAGNOSIS — D649 Anemia, unspecified: Secondary | ICD-10-CM

## 2015-12-17 DIAGNOSIS — K449 Diaphragmatic hernia without obstruction or gangrene: Secondary | ICD-10-CM | POA: Insufficient documentation

## 2015-12-17 DIAGNOSIS — R911 Solitary pulmonary nodule: Secondary | ICD-10-CM | POA: Insufficient documentation

## 2015-12-17 DIAGNOSIS — R531 Weakness: Secondary | ICD-10-CM | POA: Diagnosis not present

## 2015-12-17 DIAGNOSIS — I2510 Atherosclerotic heart disease of native coronary artery without angina pectoris: Secondary | ICD-10-CM | POA: Diagnosis not present

## 2015-12-17 DIAGNOSIS — M1990 Unspecified osteoarthritis, unspecified site: Secondary | ICD-10-CM

## 2015-12-17 DIAGNOSIS — C250 Malignant neoplasm of head of pancreas: Secondary | ICD-10-CM

## 2015-12-17 DIAGNOSIS — Z7982 Long term (current) use of aspirin: Secondary | ICD-10-CM

## 2015-12-17 DIAGNOSIS — F17210 Nicotine dependence, cigarettes, uncomplicated: Secondary | ICD-10-CM

## 2015-12-17 DIAGNOSIS — R5383 Other fatigue: Secondary | ICD-10-CM | POA: Diagnosis not present

## 2015-12-17 DIAGNOSIS — Z79899 Other long term (current) drug therapy: Secondary | ICD-10-CM

## 2015-12-17 DIAGNOSIS — Z7984 Long term (current) use of oral hypoglycemic drugs: Secondary | ICD-10-CM

## 2015-12-17 DIAGNOSIS — E119 Type 2 diabetes mellitus without complications: Secondary | ICD-10-CM

## 2015-12-17 LAB — CBC WITH DIFFERENTIAL/PLATELET
BASOS ABS: 0.1 10*3/uL (ref 0–0.1)
Basophils Relative: 1 %
EOS PCT: 1 %
Eosinophils Absolute: 0 10*3/uL (ref 0–0.7)
HEMATOCRIT: 31.1 % — AB (ref 40.0–52.0)
Hemoglobin: 10.2 g/dL — ABNORMAL LOW (ref 13.0–18.0)
LYMPHS ABS: 1 10*3/uL (ref 1.0–3.6)
LYMPHS PCT: 17 %
MCH: 27.5 pg (ref 26.0–34.0)
MCHC: 32.6 g/dL (ref 32.0–36.0)
MCV: 84.4 fL (ref 80.0–100.0)
MONO ABS: 0.4 10*3/uL (ref 0.2–1.0)
Monocytes Relative: 6 %
NEUTROS ABS: 4.5 10*3/uL (ref 1.4–6.5)
NEUTROS PCT: 75 %
Platelets: 226 10*3/uL (ref 150–440)
RBC: 3.69 MIL/uL — AB (ref 4.40–5.90)
RDW: 15.6 % — AB (ref 11.5–14.5)
WBC: 6 10*3/uL (ref 3.8–10.6)

## 2015-12-17 LAB — PROTIME-INR
INR: 1.01
Prothrombin Time: 13.5 seconds (ref 11.4–15.0)

## 2015-12-17 LAB — APTT: aPTT: 44 seconds — ABNORMAL HIGH (ref 24–36)

## 2015-12-17 LAB — GLUCOSE, CAPILLARY: Glucose-Capillary: 153 mg/dL — ABNORMAL HIGH (ref 65–99)

## 2015-12-17 MED ORDER — FLUDEOXYGLUCOSE F - 18 (FDG) INJECTION
12.1000 | Freq: Once | INTRAVENOUS | Status: AC | PRN
  Administered 2015-12-17: 12.1 via INTRAVENOUS

## 2015-12-17 NOTE — Patient Instructions (Addendum)
Patient instructed to stop aspirin 81 mg due to the internal hematoma. Teach back process performed with daughters.  On the day of the procedure, he will not be able to eat or drink anything 8 hours prior to the procedure.
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
## 2015-12-17 NOTE — Progress Notes (Signed)
Patient here for MRI/PET/lab results.

## 2015-12-17 NOTE — Telephone Encounter (Signed)
Notified patient's daughter, Lupita Dawn. Pt was noted to have a hematoma from previous pancreatic bx.  Per md, pt will come today b/w 245pm and 3pm for labs (cbc, pt, ptt) to evaluate for bleeding

## 2015-12-20 ENCOUNTER — Ambulatory Visit: Payer: Medicare Other | Admitting: Internal Medicine

## 2015-12-20 ENCOUNTER — Telehealth: Payer: Self-pay | Admitting: *Deleted

## 2015-12-20 ENCOUNTER — Other Ambulatory Visit: Payer: Self-pay | Admitting: *Deleted

## 2015-12-20 ENCOUNTER — Inpatient Hospital Stay: Payer: Medicare Other

## 2015-12-20 ENCOUNTER — Inpatient Hospital Stay: Payer: Medicare Other | Admitting: Internal Medicine

## 2015-12-20 ENCOUNTER — Other Ambulatory Visit: Payer: Self-pay | Admitting: Internal Medicine

## 2015-12-20 DIAGNOSIS — K769 Liver disease, unspecified: Secondary | ICD-10-CM

## 2015-12-20 DIAGNOSIS — D649 Anemia, unspecified: Secondary | ICD-10-CM

## 2015-12-20 DIAGNOSIS — C250 Malignant neoplasm of head of pancreas: Secondary | ICD-10-CM | POA: Diagnosis not present

## 2015-12-20 LAB — HEMOGLOBIN: Hemoglobin: 10.5 g/dL — ABNORMAL LOW (ref 13.0–18.0)

## 2015-12-20 NOTE — Telephone Encounter (Signed)
Called and spoke with daughter Lupita Dawn. Pt's hgb is 10.5 today which is improved from last Saturday.  Instructions provided for u/s guided bx of liver. Pt is to arrive at 830am for the 930am. I explained that the patient needs to be NPO 8 hrs prior to the procedure. The patient is [facility] admitting and registrations initially. He will be escorted to the special procedures department. I explained that there is a sub waiting area there in that department for family. She states that her sister, Nelle Don, will be taking her father to this appointment. She will let her sister know the details of this call. She asked if Nelle Don could contact me back in the cancer center if she had any further questions. I told Roxanne that this is fine. I will be more than happen to talk to her sister with the details.   Also contacted [facility] speciality schedulers. RN noticed that u/s guided liver bx was noted in computer to be cnl. I reached out to Robet Leu to inform him that this u/s liver bx was showing cnl in [facility]. MD would still like to proceed with bx.  He stated that MD will need to enter a new order. Gerald Stabs stated that would personally notify the u/s department that would be involved with this procedure and let this dept know that this procedure will proceed as schedule.  Asked md to enter new order for u/s guided liver bx.

## 2015-12-20 NOTE — Telephone Encounter (Signed)
Daughter, Nelle Don, called to verify that msg and directions with nurse. Teach back process performed.

## 2015-12-20 NOTE — Telephone Encounter (Signed)
U/s guided liver bx scheduled for this Wednesday, Jan. 25, 2017. Pt needs to arrive at 830am for 930am procedure.

## 2015-12-21 ENCOUNTER — Other Ambulatory Visit: Payer: Self-pay | Admitting: Radiology

## 2015-12-21 ENCOUNTER — Inpatient Hospital Stay
Admission: RE | Admit: 2015-12-21 | Discharge: 2015-12-21 | Disposition: A | Discharge status: Home / Self Care | Payer: Self-pay | Source: Ambulatory Visit | Attending: Oncology | Admitting: Oncology

## 2015-12-21 ENCOUNTER — Other Ambulatory Visit: Payer: Self-pay | Admitting: Oncology

## 2015-12-21 DIAGNOSIS — C250 Malignant neoplasm of head of pancreas: Secondary | ICD-10-CM

## 2015-12-22 ENCOUNTER — Inpatient Hospital Stay: Payer: Medicare Other

## 2015-12-22 ENCOUNTER — Ambulatory Visit
Admission: RE | Admit: 2015-12-22 | Discharge: 2015-12-22 | Disposition: A | Discharge status: Home / Self Care | Payer: Medicare Other | Source: Ambulatory Visit | Attending: Internal Medicine | Admitting: Internal Medicine

## 2015-12-22 DIAGNOSIS — K769 Liver disease, unspecified: Secondary | ICD-10-CM | POA: Diagnosis present

## 2015-12-22 DIAGNOSIS — C787 Secondary malignant neoplasm of liver and intrahepatic bile duct: Secondary | ICD-10-CM | POA: Insufficient documentation

## 2015-12-22 DIAGNOSIS — C259 Malignant neoplasm of pancreas, unspecified: Secondary | ICD-10-CM | POA: Insufficient documentation

## 2015-12-22 HISTORY — DX: Essential (primary) hypertension: I10

## 2015-12-22 HISTORY — DX: Gastro-esophageal reflux disease without esophagitis: K21.9

## 2015-12-22 LAB — CBC
HEMATOCRIT: 28.9 % — AB (ref 40.0–52.0)
Hemoglobin: 9.3 g/dL — ABNORMAL LOW (ref 13.0–18.0)
MCH: 27.3 pg (ref 26.0–34.0)
MCHC: 32.3 g/dL (ref 32.0–36.0)
MCV: 84.5 fL (ref 80.0–100.0)
PLATELETS: 219 10*3/uL (ref 150–440)
RBC: 3.41 MIL/uL — ABNORMAL LOW (ref 4.40–5.90)
RDW: 16.3 % — AB (ref 11.5–14.5)
WBC: 7.1 10*3/uL (ref 3.8–10.6)

## 2015-12-22 LAB — PROTIME-INR
INR: 0.98
Prothrombin Time: 13.2 seconds (ref 11.4–15.0)

## 2015-12-22 LAB — APTT: APTT: 41 s — AB (ref 24–36)

## 2015-12-22 MED ORDER — FENTANYL CITRATE (PF) 100 MCG/2ML IJ SOLN
INTRAMUSCULAR | Status: AC | PRN
  Administered 2015-12-22: 50 ug via INTRAVENOUS
  Administered 2015-12-22: 25 ug via INTRAVENOUS

## 2015-12-22 MED ORDER — MIDAZOLAM HCL 5 MG/5ML IJ SOLN
INTRAMUSCULAR | Status: AC | PRN
  Administered 2015-12-22: 0.5 mg via INTRAVENOUS
  Administered 2015-12-22 (×2): 1 mg via INTRAVENOUS
  Administered 2015-12-22: 0.5 mg via INTRAVENOUS

## 2015-12-22 MED ORDER — SODIUM CHLORIDE 0.9 % IV SOLN
Freq: Once | INTRAVENOUS | Status: AC
  Administered 2015-12-22 (×2): via INTRAVENOUS

## 2015-12-22 NOTE — Procedures (Signed)
Interventional Radiology Procedure Note  Procedure:  Ultrasound guided liver biopsy  Complications:  None  Estimated Blood Loss: <10 mL  2.5 cm lesion in anterior left lobe of liver sampled with 18 G core biopsy x 3 via 17 G needle.  Gelfoam pledgets advanced on completion.  Plan: 2 hour bedrest.  Eulas Post T. Kathlene Cote, M.D Pager:  [phone]
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
## 2015-12-22 NOTE — Progress Notes (Signed)
Doing well post liver biopsy, no bleeding nor pain at site, daughter present, Dr Collins [place] IN TO SEE   pt with questions answered, instructions given with questions answered

## 2015-12-23 ENCOUNTER — Ambulatory Visit: Payer: Medicare Other | Admitting: Internal Medicine

## 2015-12-23 LAB — SURGICAL PATHOLOGY

## 2015-12-24 ENCOUNTER — Inpatient Hospital Stay: Payer: Medicare Other

## 2015-12-24 ENCOUNTER — Telehealth: Payer: Self-pay

## 2015-12-24 DIAGNOSIS — D649 Anemia, unspecified: Secondary | ICD-10-CM

## 2015-12-24 DIAGNOSIS — C250 Malignant neoplasm of head of pancreas: Secondary | ICD-10-CM

## 2015-12-24 DIAGNOSIS — K769 Liver disease, unspecified: Secondary | ICD-10-CM

## 2015-12-24 LAB — HEMOGLOBIN: Hemoglobin: 10.2 g/dL — ABNORMAL LOW (ref 13.0–18.0)

## 2015-12-24 NOTE — Telephone Encounter (Signed)
  Oncology Nurse Navigator Documentation  Navigator Location: [facility] (12/24/15 1100) Navigator Encounter Type: Telephone (12/24/15 1100)                       Coordination of Care: Appts (12/24/15 1100)                  Time Spent with Patient: 15 (12/24/15 1100)   Per request of daughter Nelle Don, appt with Dr Sharen Counter has been cancelled due to them coming 1/30 to receive results from liver biopsy. All records have been sent. Appt not rescheduled at this time until they see medical oncology 1/30

## 2015-12-27 ENCOUNTER — Inpatient Hospital Stay (HOSPITAL_BASED_OUTPATIENT_CLINIC_OR_DEPARTMENT_OTHER): Payer: Medicare Other | Admitting: Internal Medicine

## 2015-12-27 ENCOUNTER — Encounter: Payer: Self-pay | Admitting: Internal Medicine

## 2015-12-27 VITALS — BP 132/86 | HR 66 | Temp 95.0°F | Resp 18 | Ht 72.0 in | Wt 125.2 lb

## 2015-12-27 DIAGNOSIS — C787 Secondary malignant neoplasm of liver and intrahepatic bile duct: Secondary | ICD-10-CM

## 2015-12-27 DIAGNOSIS — R112 Nausea with vomiting, unspecified: Secondary | ICD-10-CM

## 2015-12-27 DIAGNOSIS — R5383 Other fatigue: Secondary | ICD-10-CM

## 2015-12-27 DIAGNOSIS — R531 Weakness: Secondary | ICD-10-CM

## 2015-12-27 DIAGNOSIS — R630 Anorexia: Secondary | ICD-10-CM

## 2015-12-27 DIAGNOSIS — R634 Abnormal weight loss: Secondary | ICD-10-CM | POA: Diagnosis not present

## 2015-12-27 DIAGNOSIS — Z79899 Other long term (current) drug therapy: Secondary | ICD-10-CM

## 2015-12-27 DIAGNOSIS — R1013 Epigastric pain: Secondary | ICD-10-CM

## 2015-12-27 DIAGNOSIS — Z7982 Long term (current) use of aspirin: Secondary | ICD-10-CM

## 2015-12-27 DIAGNOSIS — C250 Malignant neoplasm of head of pancreas: Secondary | ICD-10-CM

## 2015-12-27 DIAGNOSIS — E119 Type 2 diabetes mellitus without complications: Secondary | ICD-10-CM

## 2015-12-27 DIAGNOSIS — I2510 Atherosclerotic heart disease of native coronary artery without angina pectoris: Secondary | ICD-10-CM

## 2015-12-27 MED ORDER — FENTANYL 25 MCG/HR TD PT72: 25.0000 ug | MEDICATED_PATCH | TRANSDERMAL | Status: DC

## 2015-12-27 NOTE — Progress Notes (Signed)
  Oncology Nurse Navigator Documentation  Navigator Location: [facility] (12/27/15 1300) Navigator Encounter Type: Diagnostic Results (12/27/15 1300)             Treatment Phase: Pre-Tx/Tx Discussion (12/27/15 1300) Barriers/Navigation Needs: Family concerns;Education;Coordination of Care (12/27/15 1300) Education: Coping with Diagnosis/ Prognosis (12/27/15 1300) Interventions: Coordination of Care;Medication assitance (12/27/15 1300)                      Time Spent with Patient: 23 (12/27/15 1300)   Attended appt for Gerald Molina to receive his liver biopsy results with himself and his 2 daughters. After given results they do not want to reschedule surgeon appt. Will notify [facility] regarding. Will ask PSN to assist with first [facility] appt since pt is from [place] and he cannot get script sent electronically. Hospice referral will be made. They will decide regarding any further treatment or just hospice services.
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
## 2015-12-30 ENCOUNTER — Ambulatory Visit: Payer: Medicare Other

## 2015-12-31 ENCOUNTER — Telehealth: Payer: Self-pay | Admitting: *Deleted

## 2015-12-31 MED ORDER — OXYCODONE HCL 5 MG PO TABS: ORAL_TABLET | ORAL | Status: DC

## 2015-12-31 MED ORDER — LORAZEPAM 0.5 MG PO TABS: 1.0000 mg | ORAL_TABLET | Freq: Every day | ORAL | Status: DC

## 2015-12-31 MED ORDER — LORAZEPAM 0.5 MG PO TABS: 0.5000 mg | ORAL_TABLET | Freq: Every day | ORAL | Status: AC

## 2015-12-31 NOTE — Telephone Encounter (Signed)
Per Dr. Rogue Bussing, would like to start patient on oxycodone 5mg  every 4-6hrs PRN and ativan 0.5mg  at bedtime as needed. Prescriptions faxed to [facility] in [facility] nurse aware.

## 2015-12-31 NOTE — Telephone Encounter (Signed)
Hospice nurse requesting medication to be prescribed for sleep and anxiety. Also states that pain is not well controlled on 72mcg fentanyl patch. Please advise.

## 2016-01-06 ENCOUNTER — Ambulatory Visit: Payer: Medicare Other | Admitting: Internal Medicine

## 2016-01-07 ENCOUNTER — Other Ambulatory Visit: Payer: Self-pay | Admitting: *Deleted

## 2016-01-07 DIAGNOSIS — C250 Malignant neoplasm of head of pancreas: Secondary | ICD-10-CM

## 2016-01-07 MED ORDER — FENTANYL 25 MCG/HR TD PT72: 25.0000 ug | MEDICATED_PATCH | TRANSDERMAL | Status: DC

## 2016-01-17 ENCOUNTER — Other Ambulatory Visit: Payer: Self-pay | Admitting: Internal Medicine

## 2016-01-17 ENCOUNTER — Telehealth: Payer: Self-pay | Admitting: *Deleted

## 2016-01-17 DIAGNOSIS — C252 Malignant neoplasm of tail of pancreas: Secondary | ICD-10-CM

## 2016-01-17 MED ORDER — OXYCODONE HCL ER 30 MG PO T12A: 1.0000 | EXTENDED_RELEASE_TABLET | Freq: Two times a day (BID) | ORAL | Status: DC

## 2016-01-17 NOTE — Telephone Encounter (Signed)
Family is requesting that his fentanyl be increased to 37 mcg which would mean wearing 2 patches, Mitzi feels that with his emaciated state he will not really absorb the med. He is using breakthrough med 3 - 4 times a day

## 2016-01-17 NOTE — Telephone Encounter (Signed)
Oxycodone 30 mg ER faxed [facility] in [place], [facility] notified and will discuss with family

## 2016-02-01 ENCOUNTER — Telehealth: Payer: Self-pay

## 2016-02-01 ENCOUNTER — Telehealth: Payer: Self-pay | Admitting: *Deleted

## 2016-02-01 MED ORDER — DEXAMETHASONE 2 MG PO TABS: ORAL_TABLET | ORAL | Status: AC

## 2016-02-01 MED ORDER — OXYCODONE HCL 5 MG PO TABS: ORAL_TABLET | ORAL | Status: AC

## 2016-02-01 MED ORDER — MORPHINE SULFATE ER 30 MG PO TBCR: EXTENDED_RELEASE_TABLET | ORAL | Status: DC

## 2016-02-01 NOTE — Telephone Encounter (Signed)
Requesting Oxycontin rx be changed to MS Contin 15mg  due to cost.  Needing rx sent to same pharmacy as last time.

## 2016-02-01 NOTE — Telephone Encounter (Signed)
Hospice Nurse, Mitzi, called asking for refill for MS Contin and Oxycodone 5 mg.  [facility] [place].

## 2016-02-01 NOTE — Telephone Encounter (Signed)
Called Mitzi to discuss switch from oxycontin to ms contin.  Per dr B.  Pt was currently taking 30 mg bid of oxycontin and it should be changed to ms contin 30 mg tid.  In between this call from earlier in am , rcvd another call that pt needs ms contin refill and the oxycodone refill.  Also she mentions on the phone that pt has been smoking marijuana for nausea and his appetite has not been good and she was wondering if there was a med to inc. Appetite.  Per Dr. B let us try low dose dexamethasone 2 mg daily for 7 days to see if it helps at all and let md know.  Told this to mitzi and all meds sent to pharmacy.

## 2016-02-01 NOTE — Telephone Encounter (Signed)
There is a note in that goes over the switch from oxycontin to ms contin.

## 2016-02-09 ENCOUNTER — Telehealth: Payer: Self-pay | Admitting: *Deleted

## 2016-02-09 MED ORDER — CHLORPROMAZINE HCL 25 MG PO TABS: 25.0000 mg | ORAL_TABLET | Freq: Three times a day (TID) | ORAL | Status: AC

## 2016-02-09 MED ORDER — MEGESTROL ACETATE 40 MG/ML PO SUSP: 400.0000 mg | Freq: Every day | ORAL | Status: AC

## 2016-02-09 NOTE — Telephone Encounter (Signed)
Has tried Dexamethasone 2 mg times 1 week for weight, but it has not worked. He has actually lost 3 pounds in 6 weeks. Also has had hiccoughs for several days to the point of having a headache from it; has tried Lorazepam and now baclofen tid, but  that has made him too sedated. Asking what else can be given for hiccoughs and what can be done for weight loss

## 2016-02-09 NOTE — Telephone Encounter (Signed)
Thorazine 25 mg every 6 -8 hours # 30 and Megace 40mg / 10 ml 400 mg daily per VO Dr Rogue Bussing. Mitzi notified and will call in rx

## 2016-02-18 ENCOUNTER — Other Ambulatory Visit: Payer: Self-pay

## 2016-02-18 MED ORDER — MORPHINE SULFATE ER 30 MG PO TBCR: EXTENDED_RELEASE_TABLET | ORAL | Status: AC

## 2016-02-29 ENCOUNTER — Telehealth: Payer: Self-pay | Admitting: *Deleted

## 2016-02-29 ENCOUNTER — Other Ambulatory Visit: Payer: Self-pay | Admitting: *Deleted

## 2016-02-29 DIAGNOSIS — C252 Malignant neoplasm of tail of pancreas: Secondary | ICD-10-CM

## 2016-02-29 MED ORDER — MORPHINE SULFATE (CONCENTRATE) 20 MG/ML PO SOLN: 20.0000 mg | ORAL | Status: DC | PRN

## 2016-02-29 NOTE — Progress Notes (Signed)
Roxanol [facility] faxed [facility] in [place]

## 2016-02-29 NOTE — Telephone Encounter (Signed)
Unable to swallow MS Contin and cannot crush them. They have been crushing Oxycodone and putting in Liquid, the Comfort kit only has a small amt of Roxanol. Asking for refill on Oxycodone vs Roxanol prescription be [facility] [place] 

## 2016-03-02 ENCOUNTER — Other Ambulatory Visit: Payer: Self-pay | Admitting: *Deleted

## 2016-03-02 MED ORDER — MORPHINE SULFATE (CONCENTRATE) 20 MG/ML PO SOLN: 20.0000 mg | ORAL | Status: DC | PRN

## 2016-03-02 NOTE — Telephone Encounter (Signed)
Faxed

## 2016-03-06 ENCOUNTER — Other Ambulatory Visit: Payer: Self-pay | Admitting: *Deleted

## 2016-03-06 MED ORDER — MORPHINE SULFATE (CONCENTRATE) 20 MG/ML PO SOLN: 20.0000 mg | ORAL | Status: AC | PRN

## 2016-03-10 ENCOUNTER — Other Ambulatory Visit: Payer: Self-pay | Admitting: Internal Medicine

## 2016-03-27 DEATH — deceased

## 2016-04-27 DEATH — deceased

## 2017-01-30 IMAGING — MR MR ABDOMEN WO/W CM
12 of 18 series · 30 of 48 positions shown · IV contrast (multihance)
Comparison: 12/01/2015 CT.

CLINICAL DATA: Pancreatic cancer and liver mass. Prior endoscopic
ultrasound biopsy demonstrating adenocarcinoma. History of
hepatitis-C.

EXAM:
MRI ABDOMEN WITHOUT AND WITH CONTRAST
TECHNIQUE: Multiplanar multisequence MR imaging of the abdomen was performed
both before and after the administration of intravenous contrast.
CONTRAST:  11mL MULTIHANCE GADOBENATE DIMEGLUMINE 529 MG/ML IV SOLN

[Series 2: T2 · coronal · 8.0mm · 1.37mm/px · 2 of 23 slices shown]
[im 1/23]
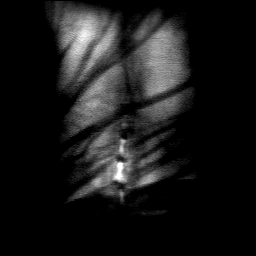
[im 23/23]
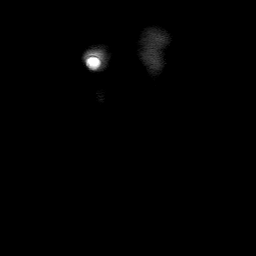

[Series 3: T2 fat-sat · axial · 7.5mm · 0.74mm/px · z∈[-109,+107]mm · 2 of 25 slices shown]
[im 1/25]
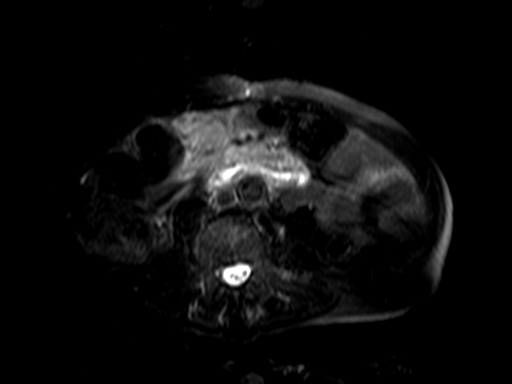
[im 25/25]
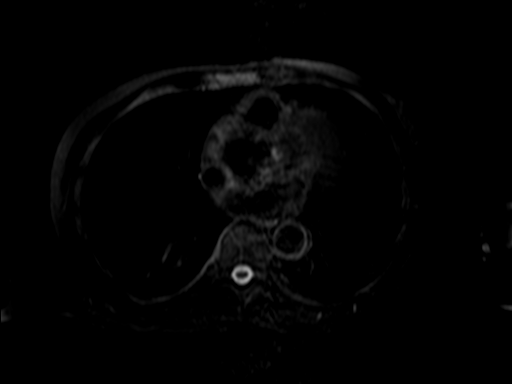

[Series 5: DWI · axial · 6.0mm · 2.73mm/px · z∈[-126,+105]mm · 6 of 99 slices shown]
[im 1/99]
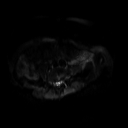
[im 20/99]
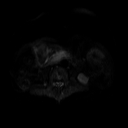
[im 40/99]
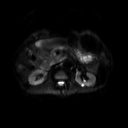
[im 59/99]
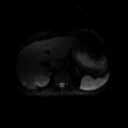
[im 79/99]
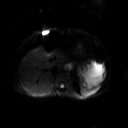
[im 99/99]
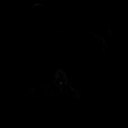

[Series 6: axial dwi_adc · axial · 6.0mm · 2.73mm/px · z∈[-126,+105]mm · 2 of 33 slices shown]
[im 1/33]
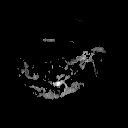
[im 33/33]
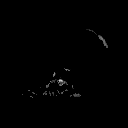

[Series 7: axial true fisp-- · axial · 5.0mm · 0.68mm/px · z∈[-125,+110]mm · 3 of 48 slices shown]
[im 1/48]
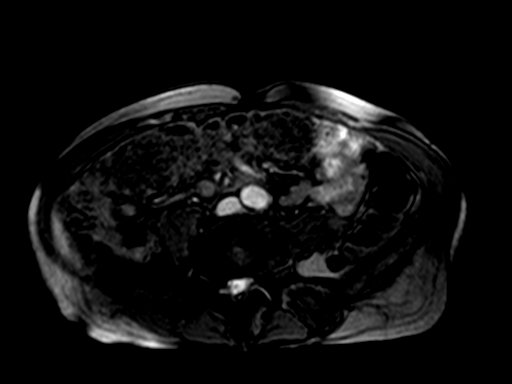
[im 24/48]
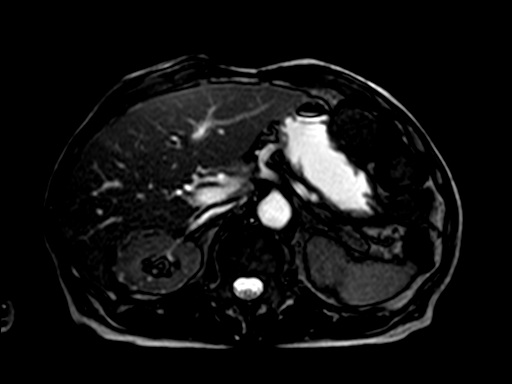
[im 48/48]
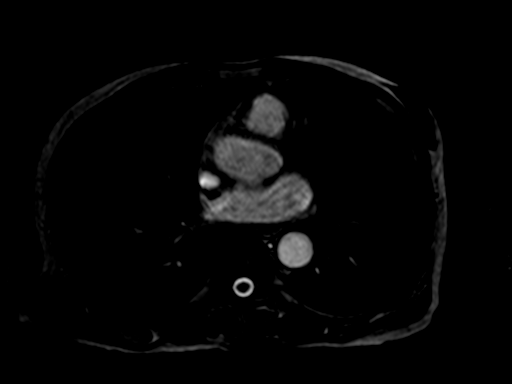

[Series 13: T2 post-contrast · axial · 8.5mm · 1.37mm/px · 1 of 25 slices shown]
[im 1/25]
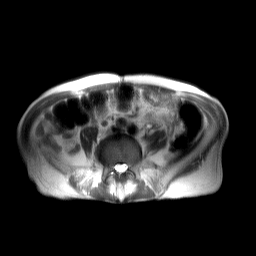

[Series 15: ax out of · axial · 7.5mm · 0.74mm/px · 1 of 25 slices shown]
[im 1/25]
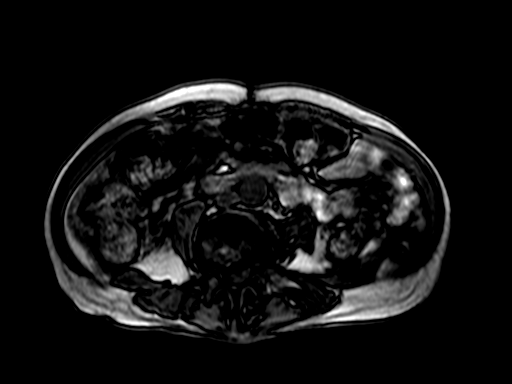

[Series 16: ax in phase · axial · 7.5mm · 0.74mm/px · 1 of 25 slices shown]
[im 1/25]
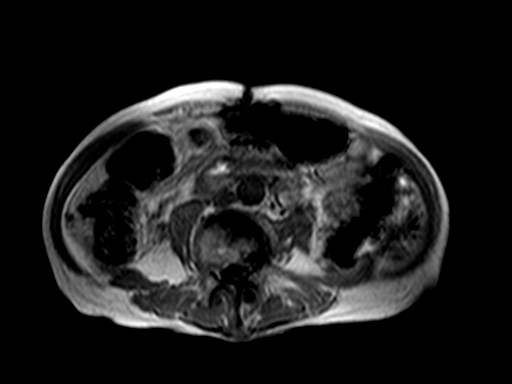

[Series 17: sub 23 sec · axial · 4.5mm · 0.68mm/px · z∈[-125,+104]mm · 3 of 52 slices shown]
[im 1/52]
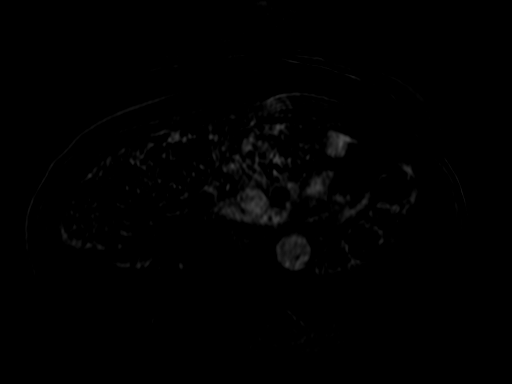
[im 26/52]
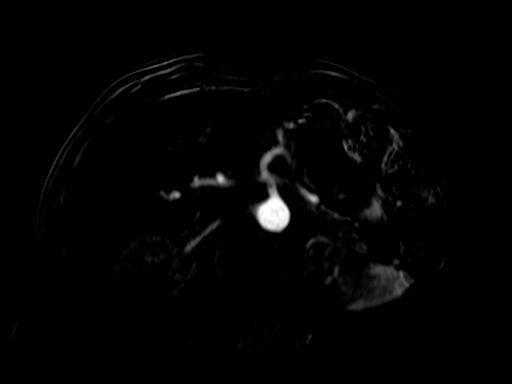
[im 52/52]
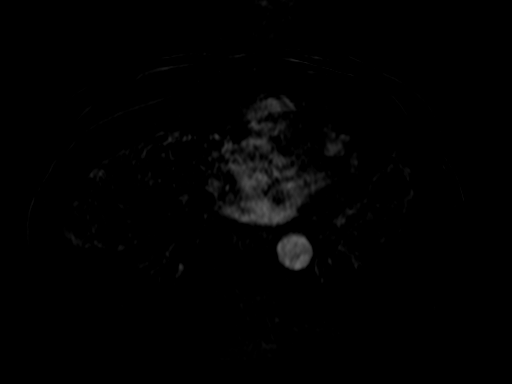

[Series 18: sub 45 sec · axial · 4.5mm · 0.68mm/px · z∈[-125,+104]mm · 3 of 52 slices shown]
[im 1/52]
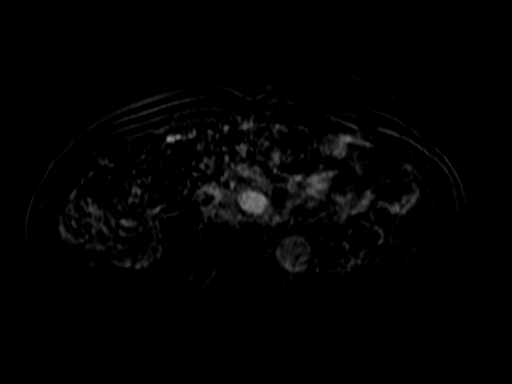
[im 26/52]
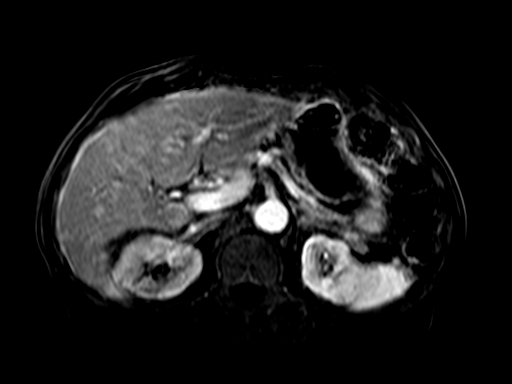
[im 52/52]
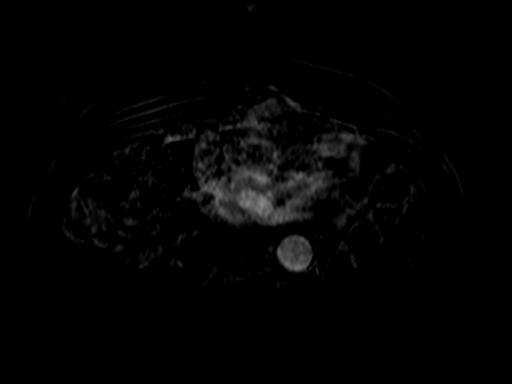

[Series 19: sub 90 sec · axial · 4.5mm · 0.68mm/px · z∈[-125,+104]mm · 3 of 52 slices shown]
[im 1/52]
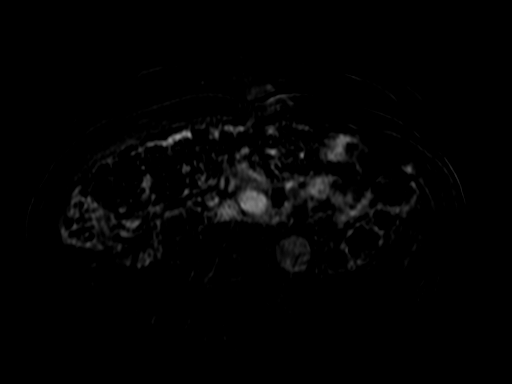
[im 26/52]
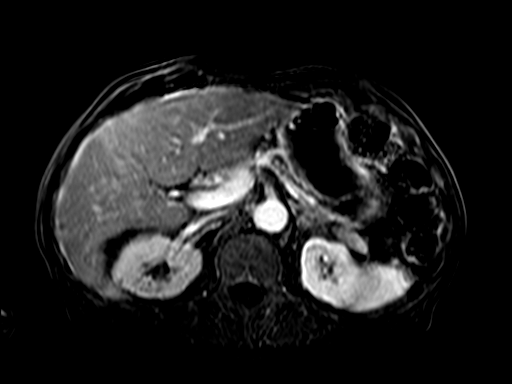
[im 52/52]
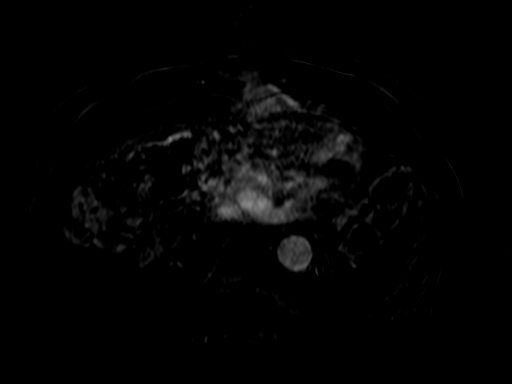

[Series 20: sub 3 min · axial · 4.5mm · 0.68mm/px · z∈[-125,+104]mm · 3 of 52 slices shown]
[im 1/52]
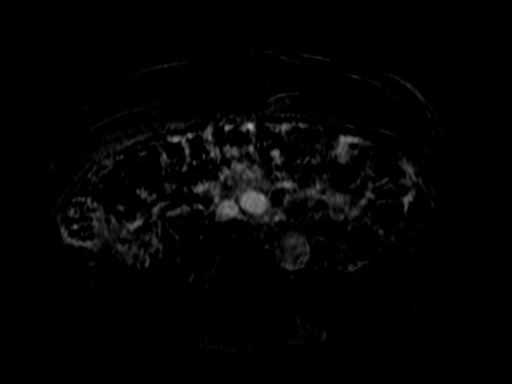
[im 26/52]
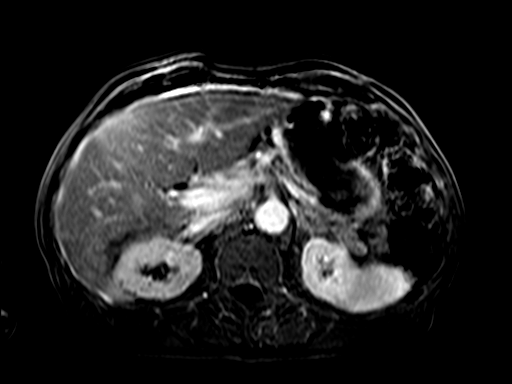
[im 52/52]
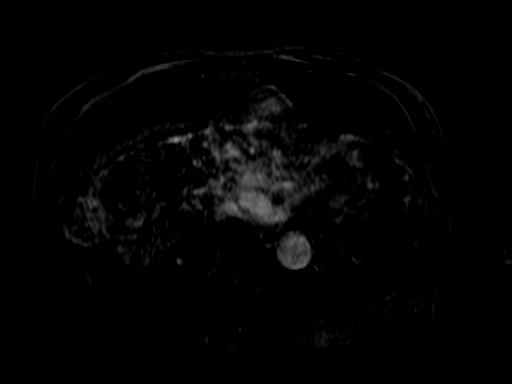

[30 of 48 positions shown; findings below may reference images not displayed]

FINDINGS: Portions of exam are mildly motion degraded.

Lower chest: Normal heart size without pericardial or pleural
effusion.

Hepatobiliary: Subtle irregular hepatic capsule, including on image
13/series 5. T2 hyperintense, peripherally enhancing mass, in the
anterior aspect of segment 4B. This measures 2.1 x 2.1 cm on image
17/series 3.
Peripherally enhancing T2 hyperintense 10 mm lesion is seen in the
inferior right hepatic lobe on image 35/ series 10. Other smaller
hepatic lesions are most apparent as evidenced by restricted
diffusion on series 5. Stone filled gallbladder, without surrounding
inflammation or biliary duct dilatation.

Pancreas: Hypo enhancing mass centered at the pancreatic uncinate
process. This measures 2.8 x 2.7 cm on image 40/ series 10.
Corresponds to restricted diffusion on series 6. Has involvement of
the superior mesenteric vein on image 38/series 10. The remainder of
the pancreas is atrophic.

Spleen: Normal in size, without focal abnormality.

Adrenals/Urinary Tract: Normal adrenal glands. Tiny bilateral renal
lesions chart most likely cysts. No hydronephrosis.

Stomach/Bowel: Normal stomach and abdominal bowel loops.

Vascular/Lymphatic: Normal caliber of the aorta and branch vessels.
Marked adenopathy within the porta hepatis including at 3.1 x 3.2 cm
on image 40/series 10.

Other: No ascites.

Musculoskeletal: S-shaped thoracolumbar spine curvature. T2 hypo
intensity within the L3 and L4 vertebral bodies was detailed on
12/01/2015 CT.
IMPRESSION: 1. Uncinate process pancreatic mass, consistent with the clinical
history of adenocarcinoma.
2. Nodal and hepatic metastasis. The extent of hepatic metastasis is
greater than questioned on CT.
3. Mildly motion degraded exam.
4. Subtle irregular hepatic capsule; suspect mild cirrhosis in this
patient with history of hepatitis-C.
5. Cholelithiasis.
